# Patient Record
Sex: Female | Born: 1950 | Race: Black or African American | Hispanic: No | Smoking: Former smoker
Health system: Southern US, Community
[De-identification: ages and names within clinical notes are randomized; demographics above are authoritative.]

## PROBLEM LIST (undated history)

## (undated) DIAGNOSIS — D279 Benign neoplasm of unspecified ovary: Secondary | ICD-10-CM

## (undated) DIAGNOSIS — J45909 Unspecified asthma, uncomplicated: Secondary | ICD-10-CM

## (undated) HISTORY — PX: ABDOMINAL HYSTERECTOMY: SHX81

## (undated) HISTORY — DX: Benign neoplasm of unspecified ovary: D27.9

---

## 1997-10-03 HISTORY — PX: SHOULDER SURGERY: SHX246

## 1998-08-23 ENCOUNTER — Emergency Department (HOSPITAL_COMMUNITY): Admission: EM | Admit: 1998-08-23 | Discharge: 1998-08-23 | Payer: Self-pay | Admitting: Emergency Medicine

## 1998-12-22 ENCOUNTER — Ambulatory Visit (HOSPITAL_BASED_OUTPATIENT_CLINIC_OR_DEPARTMENT_OTHER): Admission: RE | Admit: 1998-12-22 | Discharge: 1998-12-22 | Payer: Self-pay | Admitting: Orthopaedic Surgery

## 1999-11-02 ENCOUNTER — Other Ambulatory Visit: Admission: RE | Admit: 1999-11-02 | Discharge: 1999-11-02 | Payer: Self-pay | Admitting: Internal Medicine

## 1999-12-01 ENCOUNTER — Encounter: Admission: RE | Admit: 1999-12-01 | Discharge: 1999-12-01 | Payer: Self-pay | Admitting: Internal Medicine

## 1999-12-01 ENCOUNTER — Encounter: Payer: Self-pay | Admitting: Internal Medicine

## 2000-11-15 ENCOUNTER — Other Ambulatory Visit: Admission: RE | Admit: 2000-11-15 | Discharge: 2000-11-15 | Payer: Self-pay | Admitting: Internal Medicine

## 2000-12-04 ENCOUNTER — Encounter: Payer: Self-pay | Admitting: Internal Medicine

## 2000-12-04 ENCOUNTER — Encounter: Admission: RE | Admit: 2000-12-04 | Discharge: 2000-12-04 | Payer: Self-pay | Admitting: Internal Medicine

## 2000-12-06 ENCOUNTER — Encounter: Admission: RE | Admit: 2000-12-06 | Discharge: 2000-12-06 | Payer: Self-pay | Admitting: Internal Medicine

## 2000-12-06 ENCOUNTER — Encounter: Payer: Self-pay | Admitting: Internal Medicine

## 2000-12-26 ENCOUNTER — Encounter (INDEPENDENT_AMBULATORY_CARE_PROVIDER_SITE_OTHER): Payer: Self-pay | Admitting: Specialist

## 2000-12-26 ENCOUNTER — Other Ambulatory Visit: Admission: RE | Admit: 2000-12-26 | Discharge: 2000-12-26 | Payer: Self-pay | Admitting: Gynecology

## 2001-06-08 ENCOUNTER — Encounter: Admission: RE | Admit: 2001-06-08 | Discharge: 2001-06-08 | Payer: Self-pay | Admitting: Internal Medicine

## 2001-06-08 ENCOUNTER — Encounter: Payer: Self-pay | Admitting: Internal Medicine

## 2001-06-22 ENCOUNTER — Other Ambulatory Visit: Admission: RE | Admit: 2001-06-22 | Discharge: 2001-06-22 | Payer: Self-pay | Admitting: Gynecology

## 2001-06-22 ENCOUNTER — Encounter (INDEPENDENT_AMBULATORY_CARE_PROVIDER_SITE_OTHER): Payer: Self-pay | Admitting: Specialist

## 2001-09-12 ENCOUNTER — Encounter (INDEPENDENT_AMBULATORY_CARE_PROVIDER_SITE_OTHER): Payer: Self-pay | Admitting: *Deleted

## 2001-09-12 ENCOUNTER — Inpatient Hospital Stay (HOSPITAL_COMMUNITY): Admission: RE | Admit: 2001-09-12 | Discharge: 2001-09-13 | Payer: Self-pay | Admitting: Gynecology

## 2001-12-14 ENCOUNTER — Encounter: Admission: RE | Admit: 2001-12-14 | Discharge: 2001-12-14 | Payer: Self-pay | Admitting: Internal Medicine

## 2001-12-14 ENCOUNTER — Encounter: Payer: Self-pay | Admitting: Internal Medicine

## 2002-12-17 ENCOUNTER — Other Ambulatory Visit: Admission: RE | Admit: 2002-12-17 | Discharge: 2002-12-17 | Payer: Self-pay | Admitting: Gynecology

## 2002-12-20 ENCOUNTER — Encounter: Payer: Self-pay | Admitting: Internal Medicine

## 2002-12-20 ENCOUNTER — Encounter: Admission: RE | Admit: 2002-12-20 | Discharge: 2002-12-20 | Payer: Self-pay | Admitting: Internal Medicine

## 2004-01-02 ENCOUNTER — Other Ambulatory Visit: Admission: RE | Admit: 2004-01-02 | Discharge: 2004-01-02 | Payer: Self-pay | Admitting: Gynecology

## 2004-01-09 ENCOUNTER — Encounter: Admission: RE | Admit: 2004-01-09 | Discharge: 2004-01-09 | Payer: Self-pay | Admitting: Internal Medicine

## 2005-01-05 ENCOUNTER — Other Ambulatory Visit: Admission: RE | Admit: 2005-01-05 | Discharge: 2005-01-05 | Payer: Self-pay | Admitting: Gynecology

## 2005-01-21 ENCOUNTER — Encounter: Admission: RE | Admit: 2005-01-21 | Discharge: 2005-01-21 | Payer: Self-pay | Admitting: Internal Medicine

## 2005-06-19 ENCOUNTER — Emergency Department (HOSPITAL_COMMUNITY): Admission: EM | Admit: 2005-06-19 | Discharge: 2005-06-19 | Payer: Self-pay | Admitting: Family Medicine

## 2005-07-07 ENCOUNTER — Encounter: Admission: RE | Admit: 2005-07-07 | Discharge: 2005-07-07 | Payer: Self-pay | Admitting: Internal Medicine

## 2006-03-09 ENCOUNTER — Other Ambulatory Visit: Admission: RE | Admit: 2006-03-09 | Discharge: 2006-03-09 | Payer: Self-pay | Admitting: Gynecology

## 2006-04-04 ENCOUNTER — Encounter: Admission: RE | Admit: 2006-04-04 | Discharge: 2006-04-04 | Payer: Self-pay | Admitting: Orthopaedic Surgery

## 2007-04-05 ENCOUNTER — Other Ambulatory Visit: Admission: RE | Admit: 2007-04-05 | Discharge: 2007-04-05 | Payer: Self-pay | Admitting: Gynecology

## 2007-05-08 ENCOUNTER — Encounter: Admission: RE | Admit: 2007-05-08 | Discharge: 2007-05-08 | Payer: Self-pay | Admitting: Internal Medicine

## 2008-12-02 ENCOUNTER — Encounter: Admission: RE | Admit: 2008-12-02 | Discharge: 2008-12-02 | Payer: Self-pay | Admitting: Internal Medicine

## 2010-01-06 ENCOUNTER — Encounter: Admission: RE | Admit: 2010-01-06 | Discharge: 2010-01-06 | Payer: Self-pay | Admitting: Internal Medicine

## 2011-01-02 ENCOUNTER — Emergency Department (HOSPITAL_COMMUNITY)
Admission: EM | Admit: 2011-01-02 | Discharge: 2011-01-02 | Disposition: A | Discharge status: Home / Self Care | Payer: BC Managed Care – PPO | Attending: Emergency Medicine | Admitting: Emergency Medicine

## 2011-01-02 ENCOUNTER — Emergency Department (HOSPITAL_COMMUNITY): Payer: BC Managed Care – PPO

## 2011-01-02 ENCOUNTER — Inpatient Hospital Stay (INDEPENDENT_AMBULATORY_CARE_PROVIDER_SITE_OTHER)
Admission: RE | Admit: 2011-01-02 | Discharge: 2011-01-02 | Disposition: A | Discharge status: Home / Self Care | Payer: BC Managed Care – PPO | Source: Ambulatory Visit | Attending: Family Medicine | Admitting: Family Medicine

## 2011-01-02 DIAGNOSIS — R079 Chest pain, unspecified: Secondary | ICD-10-CM

## 2011-01-02 DIAGNOSIS — M1990 Unspecified osteoarthritis, unspecified site: Secondary | ICD-10-CM | POA: Insufficient documentation

## 2011-01-02 DIAGNOSIS — R112 Nausea with vomiting, unspecified: Secondary | ICD-10-CM | POA: Insufficient documentation

## 2011-01-02 DIAGNOSIS — R61 Generalized hyperhidrosis: Secondary | ICD-10-CM | POA: Insufficient documentation

## 2011-01-02 LAB — CBC
HCT: 41 % (ref 36.0–46.0)
Hemoglobin: 14.3 g/dL (ref 12.0–15.0)
MCH: 29.7 pg (ref 26.0–34.0)
MCHC: 34.9 g/dL (ref 30.0–36.0)
MCV: 85.1 fL (ref 78.0–100.0)
RDW: 12.8 % (ref 11.5–15.5)

## 2011-01-02 LAB — COMPREHENSIVE METABOLIC PANEL
ALT: 18 U/L (ref 0–35)
BUN: 12 mg/dL (ref 6–23)
CO2: 22 mEq/L (ref 19–32)
Calcium: 8.7 mg/dL (ref 8.4–10.5)
Creatinine, Ser: 0.65 mg/dL (ref 0.4–1.2)
GFR calc non Af Amer: >60 mL/min (ref >60)
Glucose, Bld: 97 mg/dL (ref 70–99)
Sodium: 139 mEq/L (ref 135–145)

## 2011-01-02 LAB — DIFFERENTIAL
Eosinophils Relative: 6 % — ABNORMAL HIGH (ref 0–5)
Lymphocytes Relative: 43 % (ref 12–46)
Lymphs Abs: 1.9 10*3/uL (ref 0.7–4.0)
Monocytes Absolute: 0.6 10*3/uL (ref 0.1–1.0)
Monocytes Relative: 13 % — ABNORMAL HIGH (ref 3–12)
Neutro Abs: 1.7 10*3/uL (ref 1.7–7.7)

## 2011-01-02 LAB — POCT CARDIAC MARKERS: CKMB, poc: 1.2 ng/mL (ref 1.0–8.0)

## 2011-01-02 LAB — LIPASE, BLOOD: Lipase: 37 U/L (ref 11–59)

## 2011-02-18 NOTE — Op Note (Signed)
[facility]  Patient:    [name], [name] Visit Number: [id number] MRN: [id number]          Service Type: GYN Location: 4W [id number] 01 Attending Physician:  Rolinda Roan Dictated by:   Leatha Gilding. Mezer, M.D. Proc. Date: 09/12/01 Admit Date:  09/12/2001   CC:         Harl Bowie, M.D.  Modesta Messing, M.D.   Operative Report  PREOPERATIVE DIAGNOSIS:  Endometrial hyperplasia.  POSTOPERATIVE DIAGNOSIS:  Endometrial hyperplasia, pending pathology.  OPERATION PERFORMED:  Total abdominal hysterectomy.  SURGEON:  Leatha Gilding. Mezer, M.D.  ASSISTANT:  Harl Bowie, M.D.  ANESTHESIA:  General endotracheal.  PREPARATION:  Betadine.  INDICATIONS FOR PROCEDURE:  The patient is a 60 year old female, who was referred for evaluation of endometrial cells on Pap smear.  An endometrial biopsy revealed endometrial hyperplasia.  Further biopsy after Provera revealed complex endometrial hyperplasia and despite further Provera, endometrial hyperplasia was still present, and the patient has decided to proceed with hysterectomy.  FINDINGS:  The uterus was top-normal in size.  The ovaries were grossly normal bilaterally, and there were adhesions of bowel to the left pelvic sidewall that did not interfere with the surgery.  DESCRIPTION OF PROCEDURE:  With the patient in the supine position, she was prepped and draped in the routine fashion.  A Pfannenstiel incision was made through the skin and subcutaneous tissue.  The fascia and peritoneum were opened without difficulty.  A brief exploration of her upper abdomen was benign.  Exploration of the pelvis revealed the uterus to be top-normal in size and, as stated above, the ovaries were grossly normal.  The pediatric Balfour retractor was inserted, taking great care to be certain that the bowel was out of harms way.  The round ligaments were suture ligated with #1 chromic and divide.  The  anterior leaf of the broad ligaments was then opened bilaterally and the bladder easily taken down sharply and bluntly.  The uteroovarian ligaments were clamped, cut, and free tied with #1 chromic and then suture ligated with #1 chromic.  The uterine arteries were then clamped, cut, and suture ligated with #1 chromic.  The cardinal ligaments taken in separate bites, clamped, cut, and suture ligated with #1 chromic.  The uterosacral ligaments were taken separately clamped, cut, and suture ligated with #1 chromic.  The vagina was entered laterally on the left side and the specimen excised with circumferential dissection.  As the patient has previously had a cone biopsy of the cervix, care was taken to completely excise the cervix.  The angles of the vaginal cuff were then closed with Telinde-type angle sutures of #1 chromic, and the cuff was then whipped anteriorly and posteriorly with running lock #1 chromic suture.  Two anterior-posterior stitches were placed to close the opening of the vagina. The pelvis was irrigated, and careful attention was paid to hemostasis.  With the ureters re-identified and found to be out of harms way and hemostasis intact, the bladder flap was then placed over the vaginal cuff with a running 3-0 Vicryl suture.  At the completion of the procedure, an effort was made to place the large bowel in the cul-de-sac.  The ovaries were elevated.  The omentum was brought down.  The abdomen was closed in layers using a running 2-0 Vicryl on the peritoneum, running 0 Vicryl at the midline bilaterally on the fascia.  Hemostasis was assured in the subcutaneous tissue, and the skin was closed with  staples.  The estimated blood loss was approximately 250 cc. The sponge, needle, and instrument counts were correct x 2.  The patient tolerated the procedure well and was taken to the recovery room in satisfactory condition. Dictated by:   Leatha Gilding. Mezer, M.D. Attending Physician:   Rolinda Roan DD:  09/12/01 TD:  09/12/01 Job: [id number]

## 2011-02-18 NOTE — H&P (Signed)
[facility]  Patient:    [name], [name]. Visit Number: [id number] MRN: [id number]          Service Type: Attending:  Leatha Gilding. Mezer, M.D. Dictated by:   Leatha Gilding. Mezer, M.D. Adm. Date:  09/12/01   CC:         Modesta Messing, M.D.   History and Physical  ADMITTING DIAGNOSIS:  Endometrial hyperplasia.  HISTORY OF PRESENT ILLNESS:  The patient is a 60 year old gravida 3, para 1, AB 2 female, admitted with endometrial hyperplasia for a total abdominal hysterectomy, question bilateral salpingo-oophorectomy.  The patient presented in March 2002, kindly referred by Modesta Messing, M.D., for evaluation of endometrial cells on Pap smear.  Endometrial biopsy at that time revealed a focal simple hyperplasia.  The patient was treated with Provera, and repeat endometrial biopsy in September 2002 revealed a complex atypical hyperplasia. A hysteroscopy/D&C was performed at that time, which revealed less severe hyperplasia with the pathology report being returned as a disordered proliferative endometrium with focal hyperplasia.  There was focal complex hyperplasia, but no atypia was identified.  Options were discussed with the patient at that time, which included more intensive progestin therapy with repeat biopsy versus total abdominal hysterectomy.  The patient strongly wished to proceed with hysterectomy.  A total abdominal hysterectomy, question bilateral salpingo-oophorectomy had been discussed with the patient in detail and potential complications including but not limited to anesthesia, injury to the bowel, bladder, ureters, possible fistula formation, possible blood loss with transfusion and sequelae, and infection have been discussed. The pros and cons of removing versus saving the ovaries have been discussed, and the patient wishes to leave her ovaries unless there is surgical indication to remove them.  Postoperative expectations and  restrictions have been reviewed in detail.  PAST MEDICAL HISTORY:  Medical noncontributory.  PAST SURGICAL HISTORY:  Shoulder, a cone biopsy, hysteroscopy/D&C.  MEDICATIONS:  Vitamins.  ALLERGIES:  AMPICILLIN (a rash, but has taken penicillin products since then without difficulty).  SOCIAL HISTORY:  The patient is retired from [employer] and does not smoke. ETOH social.  FAMILY HISTORY:  Positive for carcinoma of the bladder and pancreas.  PHYSICAL EXAMINATION:  HEENT:  Negative.  CHEST:  The lungs are clear.  CARDIAC:  The heart is without murmurs.  BREASTS:  Without masses or discharge.  ABDOMEN:  Soft and nontender.  PELVIC:  BUS, vagina, and cervix normal.  The uterus is anterior, top normal in size.   The adnexa without palpable masses.  EXTREMITIES:  Negative.  IMPRESSION:  Endometrial hyperplasia.  PLAN:  Total abdominal hysterectomy, question bilateral salpingo-oophorectomy. Dictated by:   Leatha Gilding. Mezer, M.D. Attending:  Leatha Gilding. Mezer, M.D. DD:  09/12/01 TD:  09/12/01 Job: [id number]

## 2011-06-16 ENCOUNTER — Other Ambulatory Visit: Payer: Self-pay | Admitting: Gynecologic Oncology

## 2011-06-16 ENCOUNTER — Ambulatory Visit (HOSPITAL_COMMUNITY)
Admission: RE | Admit: 2011-06-16 | Discharge: 2011-06-16 | Disposition: A | Discharge status: Home / Self Care | Payer: BC Managed Care – PPO | Source: Ambulatory Visit | Attending: Gynecologic Oncology | Admitting: Gynecologic Oncology

## 2011-06-16 ENCOUNTER — Ambulatory Visit: Payer: BC Managed Care – PPO | Attending: Gynecologic Oncology | Admitting: Gynecologic Oncology

## 2011-06-16 DIAGNOSIS — R1900 Intra-abdominal and pelvic swelling, mass and lump, unspecified site: Secondary | ICD-10-CM

## 2011-06-16 DIAGNOSIS — N879 Dysplasia of cervix uteri, unspecified: Secondary | ICD-10-CM | POA: Insufficient documentation

## 2011-06-16 DIAGNOSIS — N839 Noninflammatory disorder of ovary, fallopian tube and broad ligament, unspecified: Secondary | ICD-10-CM | POA: Insufficient documentation

## 2011-06-16 DIAGNOSIS — N9489 Other specified conditions associated with female genital organs and menstrual cycle: Secondary | ICD-10-CM | POA: Insufficient documentation

## 2011-06-16 DIAGNOSIS — Z90710 Acquired absence of both cervix and uterus: Secondary | ICD-10-CM | POA: Insufficient documentation

## 2011-06-17 NOTE — Consult Note (Signed)
[name], [name] NO.:  [id number]  MEDICAL RECORD NO.:  [id number]  LOCATION:  U                            FACILITY:  [facility]  PHYSICIAN:  Laurette Schimke, MD     DATE OF BIRTH:  1950/12/06  DATE OF CONSULTATION:  06/16/2011 DATE OF DISCHARGE:                                CONSULTATION   REASON FOR VISIT:  Consideration was requested by Dr. Chevis Pretty for evaluation of a pelvic mass.  HISTORY OF PRESENT ILLNESS:  This is a 60 year old gravida 2, para 1, last normal menstrual period in 2002.  Ms. [name] reports that she underwent total abdominal hysterectomy for leiomyoma and an abnormal Pap.  Final pathology was notable for complex atypical hyperplasia with atypia.  There was no evidence of carcinoma.  Ms. [name] was in her usual state of health until she had some physical exertion and she felt that she pulled her muscle.  She underwent chiropractor evaluation, and at that time, imaging was notable for suspicion of a pelvic mass.  Dr. Chevis Pretty subsequently obtained a pelvic ultrasound, which suggested the presence of a 6 cm mass measuring 6.8 cm that was complex in nature without any evidence of ascites.  PAST MEDICAL HISTORY:  Right carpal tunnel syndrome.  PAST SURGICAL HISTORY:  Right arthroscopic shoulder surgery in 1999, total abdominal hysterectomy in December 2002, cervical cone greater than 25 years ago, and D and C in 1972.  SCREENING HISTORY:  Mammogram in 2012 within normal limits.  Colonoscopy several years ago.  PAST GYNECOLOGIC HISTORY:  Gravida 2, para 1, one vaginal delivery.  She reports likely a cornual ectopic managed with a D and C and a history of a cervical cone for abnormal Pap 25 years ago, with no recurrence of abnormal Pap until prior to her hysterectomy.  SOCIAL HISTORY:  She reports tobacco use 1 pack per week for over 10 years.  She stopped smoking approximately 2 weeks ago.  Occasional alcohol use.  She is employed as a  Medical sales representative as Event organiser, daughter temporarily resides with her.  FAMILY HISTORY:  Reports her mother with uterine cancer in her 66s, who subsequently died of an MI.  Father with bladder cancer diagnosed in 45s.  Brother with pancreatic cancer diagnosed at age of 84 and recent death.  Maternal cousin with lymphoma diagnosed at the age of 28.  MEDICATIONS: 1. Vicodin. 2. Silica powder. 3. Multivitamins. 4. Vitamin D.  ALLERGIES:  No known drug allergies.  PHYSICAL EXAMINATION:  GENERAL:  Well-developed female, in no acute distress.  Weight 184 pounds, height 5 feet 3 inches. VITAL SIGNS:  Blood pressure 126/80, pulse 64, temperature 98.2, and respiratory rate 16. LYMPH NODES:  Reveal no cervical, supraclavicular, or inguinal adenopathy. CHEST:  Clear to auscultation. HEART:  Regular rate and rhythm. BACK:  No CVA tenderness. ABDOMEN:  Soft, obese.  Pfannenstiel incision is evident without any evidence of a hernia.  No palpable fluid waves or omental cake. PELVIC:  Normal external genitalia, Bartholin's, urethral, and Skene's. Smooth, but fixed right lower quadrant mass is appreciated.  Mass is soft.  No nodules are appreciated. RECTAL:  Good anal sphincter tone without any masses. EXTREMITIES:  1 to 2+ edema of lower extremities.  OTHER IMAGING:  Pelvic ultrasound confirmed the presence of a complex right adnexal mass 6 x 6 cm with solid components.  The mass appeared exophytic.  IMPRESSION:  Ms. [name] is a 60 year old with a solid complex adnexal mass of the right adnexa, history of prior hysterectomy.  CA-125 is not available.  RECOMMENDATION:  For bilateral salpingo-oophorectomy and staging as indicated.  A CA-125 will be requested.  If the CA-125 is within normal limits, based on examination and anesthesia, consideration will be placed for a transverse incision.  Otherwise, the patient is aware that more likely it will be a vertical incision.  If malignancy is  encountered other indicated procedures may be inclusive of lymph node dissection, omentectomy, biopsies, and debulking.  Risks and benefits of surgery were discussed with the patient and her daughter and all their questions were answered.  Surgery scheduled with me on Tuesday July 05, 2011.     Laurette Schimke, MD     WB/MEDQ  D:  06/16/2011  T:  06/16/2011  Job:  [id number]  cc:   Leatha Gilding. Mezer, M.D. Fax: [phone]  Telford Nab, R.N. [address] [place], [place] [address]  Electronically Signed by Laurette Schimke MD on 06/17/2011 10:43:48 AM

## 2011-07-01 ENCOUNTER — Other Ambulatory Visit: Payer: Self-pay | Admitting: Gynecologic Oncology

## 2011-07-01 ENCOUNTER — Encounter (HOSPITAL_COMMUNITY): Payer: BC Managed Care – PPO

## 2011-07-01 LAB — DIFFERENTIAL
Basophils Relative: 1 % (ref 0–1)
Eosinophils Relative: 5 % (ref 0–5)
Lymphocytes Relative: 52 % — ABNORMAL HIGH (ref 12–46)
Monocytes Absolute: 0.3 10*3/uL (ref 0.1–1.0)
Monocytes Relative: 8 % (ref 3–12)
Neutro Abs: 1.3 10*3/uL — ABNORMAL LOW (ref 1.7–7.7)

## 2011-07-01 LAB — CBC
HCT: 39.2 % (ref 36.0–46.0)
Hemoglobin: 13.5 g/dL (ref 12.0–15.0)
MCH: 29 pg (ref 26.0–34.0)
MCHC: 34.4 g/dL (ref 30.0–36.0)

## 2011-07-01 LAB — COMPREHENSIVE METABOLIC PANEL
BUN: 15 mg/dL (ref 6–23)
Calcium: 9.2 mg/dL (ref 8.4–10.5)
GFR calc Af Amer: >60 mL/min (ref >60)
Glucose, Bld: 104 mg/dL — ABNORMAL HIGH (ref 70–99)
Total Protein: 7.2 g/dL (ref 6.0–8.3)

## 2011-07-01 LAB — SURGICAL PCR SCREEN
MRSA, PCR: NEGATIVE
Staphylococcus aureus: NEGATIVE

## 2011-07-04 HISTORY — PX: BILATERAL SALPINGOOPHORECTOMY: SHX1223

## 2011-07-05 ENCOUNTER — Inpatient Hospital Stay (HOSPITAL_COMMUNITY)
Admission: RE | Admit: 2011-07-05 | Discharge: 2011-07-07 | DRG: 359 | Disposition: A | Discharge status: Home / Self Care | Payer: BC Managed Care – PPO | Source: Ambulatory Visit | Attending: Obstetrics & Gynecology | Admitting: Obstetrics & Gynecology

## 2011-07-05 ENCOUNTER — Other Ambulatory Visit: Payer: Self-pay | Admitting: Gynecologic Oncology

## 2011-07-05 DIAGNOSIS — D279 Benign neoplasm of unspecified ovary: Principal | ICD-10-CM | POA: Diagnosis present

## 2011-07-05 LAB — SAMPLE TO BLOOD BANK

## 2011-07-06 LAB — CBC
MCHC: 33.4 g/dL (ref 30.0–36.0)
RDW: 14 % (ref 11.5–15.5)

## 2011-07-11 NOTE — Op Note (Signed)
[name], [name] NO.:  [id number]  MEDICAL RECORD NO.:  [id number]  LOCATION:  1536                         FACILITY:  [facility]  PHYSICIAN:  Laurette Schimke, MD     DATE OF BIRTH:  [birth year]  DATE OF PROCEDURE:  07/05/2011 DATE OF DISCHARGE:                              OPERATIVE REPORT   PREOPERATIVE DIAGNOSIS:  Pelvic mass.  POSTOPERATIVE DIAGNOSIS:  Bilateral benign pelvic masses, ICD-9 220.0.  PROCEDURE:  Exploratory laparotomy, bilateral salpingo-oophorectomy.  SURGEON:  Laurette Schimke, MD  ASSISTANT:  Roseanna Rainbow, M.D. and Telford Nab, R.N.  ANESTHESIA:  General endotracheal.  FINDINGS:  A 10 cm solid right pelvic mass, 5 cm solid left pelvic mass adherent to the cecum.  SPECIMENS:  Bilateral ovaries and tubes.  DESCRIPTION OF PROCEDURE:  Patient was taken to operating room, placed on general endotracheal anesthesia, was placed in dorsal lithotomy position and prepped and draped in usual sterile fashion.  The midline incision was made just superior to the reflection of her pannus approximately 10 cm, the abdomen was entered.  The pelvic washings were obtained.  The abdomen and pelvis were explored were notable for a 10-cm right-sided pelvic mass and a left-sided solid left adnexal mass adherent to the cecum.  The adhesions surrounding the right pelvic mass were reduced.  The laps were placed in the gutters and the small bowel packed into the upper abdomen.  The retroperitoneal space was entered. The ureter was identified.  The infundibulopelvic ligament was identified, secured, transected, and ligated.  The mass, the broad ligament in its attachments to the mass were dissected free.  The mass was sent for frozen section.  On the contralateral side, the sigmoid was noted to be adherent and superior to the mass.  These dissections were transected.  There was some deserosalization of the bowel.  The mucosa, however, was intact.   Muscularis was intact with 2 interrupted Vicryl sutures were placed to oversew the area of bruising.  The retroperitoneal space was entered, and the mass was dissected from its broad ligament attachments.  The infundibulopelvic ligament was identified, skeletonized, clamped, transected, ligated and sent for frozen section.  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  The abdomen was copiously irrigated and drained with water.  Hemostasis was assured.  Abdominal packs were removed.  The fascia was closed with 0 loop PDS sutures, overlapping in the midline. Subcutaneous tissues were copiously irrigated and drained and Marcaine injected into the incision.  The subcutaneous tissue approximately 7 mm from the skin was approximated with a running suture.  The skin was closed with a running subcuticular 4-0 Vicryl.  Sponges and needle count correct x3.  COMPLICATIONS:  None.  DRAINS:  Foley draining clear urine.  DISPOSITION:  The patient was extubated and taken to the recovery room in stable condition.     Laurette Schimke, MD     WB/MEDQ  D:  07/05/2011  T:  07/06/2011  Job:  [id number]  cc:   Telford Nab, R.N. [address] [place], [place] [address]  Roseanna Rainbow, M.D. Fax: [phone]  Leatha Gilding. Mezer, M.D. Fax: [phone]  Electronically Signed  by Laurette Schimke MD on 07/11/2011 03:14:30 PM

## 2011-09-01 ENCOUNTER — Encounter: Payer: Self-pay | Admitting: Gynecologic Oncology

## 2011-09-01 ENCOUNTER — Ambulatory Visit: Payer: BC Managed Care – PPO | Attending: Gynecologic Oncology | Admitting: Gynecologic Oncology

## 2011-09-01 VITALS — BP 120/70 | HR 78 | Temp 98.6°F | Resp 18 | Ht 63.0 in | Wt 187.9 lb

## 2011-09-01 DIAGNOSIS — Z90710 Acquired absence of both cervix and uterus: Secondary | ICD-10-CM | POA: Insufficient documentation

## 2011-09-01 DIAGNOSIS — N839 Noninflammatory disorder of ovary, fallopian tube and broad ligament, unspecified: Secondary | ICD-10-CM | POA: Insufficient documentation

## 2011-09-01 DIAGNOSIS — Z9079 Acquired absence of other genital organ(s): Secondary | ICD-10-CM | POA: Insufficient documentation

## 2011-09-01 DIAGNOSIS — D279 Benign neoplasm of unspecified ovary: Secondary | ICD-10-CM

## 2011-09-01 HISTORY — DX: Benign neoplasm of unspecified ovary: D27.9
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
## 2011-09-01 NOTE — Patient Instructions (Signed)
Followup with Dr. Chevis Pretty

## 2011-11-17 ENCOUNTER — Ambulatory Visit (INDEPENDENT_AMBULATORY_CARE_PROVIDER_SITE_OTHER): Payer: BC Managed Care – PPO | Admitting: Emergency Medicine

## 2011-11-17 VITALS — BP 156/90 | HR 92 | Temp 98.5°F | Resp 18 | Ht 64.5 in | Wt 191.6 lb

## 2011-11-17 DIAGNOSIS — J9801 Acute bronchospasm: Secondary | ICD-10-CM

## 2011-11-17 DIAGNOSIS — J45909 Unspecified asthma, uncomplicated: Secondary | ICD-10-CM

## 2011-11-17 DIAGNOSIS — J209 Acute bronchitis, unspecified: Secondary | ICD-10-CM

## 2011-11-17 DIAGNOSIS — J40 Bronchitis, not specified as acute or chronic: Secondary | ICD-10-CM

## 2011-11-17 DIAGNOSIS — R05 Cough: Secondary | ICD-10-CM

## 2011-11-17 MED ORDER — ALBUTEROL SULFATE (2.5 MG/3ML) 0.083% IN NEBU
2.5000 mg | INHALATION_SOLUTION | Freq: Once | RESPIRATORY_TRACT | Status: AC
  Administered 2011-11-17: 2.5 mg via RESPIRATORY_TRACT

## 2011-11-17 MED ORDER — ALBUTEROL SULFATE HFA 108 (90 BASE) MCG/ACT IN AERS: 2.0000 | INHALATION_SPRAY | Freq: Four times a day (QID) | RESPIRATORY_TRACT | Status: DC | PRN

## 2011-11-17 MED ORDER — HYDROCODONE-HOMATROPINE 5-1.5 MG/5ML PO SYRP: 5.0000 mL | ORAL_SOLUTION | Freq: Three times a day (TID) | ORAL | Status: AC | PRN

## 2011-11-17 MED ORDER — AZITHROMYCIN 250 MG PO TABS: ORAL_TABLET | ORAL | Status: AC

## 2011-11-17 NOTE — Progress Notes (Signed)
  Subjective:    Patient ID: [name], female    DOB: [birth year], 61 y.o.   MRN: [id number]  HPI patient enters with head congestion runny nose. She also has a cough with some wheezing. She rarely smokes. She's had a minimal productive cough but has had some yellowish sputum.    Review of Systems is noncontributory except as relates to the present illness.     Objective:   Physical Exam HEENT exam reveals reddish discoloration of the turbinates. TMs are clear throat normal neck supple without adenopathy chest exam reveals bilateral expiratory wheezes        Assessment & Plan:  Assessment is acute bronchitis sinusitis with reactive airways disease. Patient did improve after nebulizer treatment in the office.

## 2011-11-17 NOTE — Patient Instructions (Signed)

## 2014-06-04 ENCOUNTER — Other Ambulatory Visit: Payer: Self-pay | Admitting: Gynecology

## 2014-06-05 LAB — CYTOLOGY - PAP

## 2015-05-07 DIAGNOSIS — I10 Essential (primary) hypertension: Secondary | ICD-10-CM | POA: Diagnosis not present

## 2015-05-07 DIAGNOSIS — E780 Pure hypercholesterolemia: Secondary | ICD-10-CM | POA: Diagnosis not present

## 2015-05-07 DIAGNOSIS — G8929 Other chronic pain: Secondary | ICD-10-CM | POA: Diagnosis not present

## 2015-05-07 DIAGNOSIS — E663 Overweight: Secondary | ICD-10-CM | POA: Diagnosis not present

## 2015-05-07 DIAGNOSIS — Z6835 Body mass index (BMI) 35.0-35.9, adult: Secondary | ICD-10-CM | POA: Diagnosis not present

## 2015-05-07 DIAGNOSIS — M810 Age-related osteoporosis without current pathological fracture: Secondary | ICD-10-CM | POA: Diagnosis not present

## 2015-05-07 DIAGNOSIS — Z0000 Encounter for general adult medical examination without abnormal findings: Secondary | ICD-10-CM | POA: Diagnosis not present

## 2015-06-18 DIAGNOSIS — Z1231 Encounter for screening mammogram for malignant neoplasm of breast: Secondary | ICD-10-CM | POA: Diagnosis not present

## 2015-06-18 DIAGNOSIS — Z6834 Body mass index (BMI) 34.0-34.9, adult: Secondary | ICD-10-CM | POA: Diagnosis not present

## 2015-06-18 DIAGNOSIS — Z01419 Encounter for gynecological examination (general) (routine) without abnormal findings: Secondary | ICD-10-CM | POA: Diagnosis not present

## 2015-07-22 ENCOUNTER — Other Ambulatory Visit: Payer: Self-pay | Admitting: Gastroenterology

## 2015-08-13 ENCOUNTER — Encounter (HOSPITAL_COMMUNITY): Payer: Self-pay | Admitting: *Deleted

## 2015-08-20 ENCOUNTER — Ambulatory Visit (HOSPITAL_COMMUNITY): Payer: Medicare Other | Admitting: Anesthesiology

## 2015-08-20 ENCOUNTER — Encounter (HOSPITAL_COMMUNITY): Payer: Self-pay

## 2015-08-20 ENCOUNTER — Ambulatory Visit (HOSPITAL_COMMUNITY)
Admission: RE | Admit: 2015-08-20 | Discharge: 2015-08-20 | Disposition: A | Discharge status: Home / Self Care | Payer: Medicare Other | Source: Ambulatory Visit | Attending: Gastroenterology | Admitting: Gastroenterology

## 2015-08-20 ENCOUNTER — Encounter (HOSPITAL_COMMUNITY)
Admission: RE | Disposition: A | Discharge status: Home / Self Care | Payer: Self-pay | Source: Ambulatory Visit | Attending: Gastroenterology

## 2015-08-20 DIAGNOSIS — J45909 Unspecified asthma, uncomplicated: Secondary | ICD-10-CM | POA: Diagnosis not present

## 2015-08-20 DIAGNOSIS — Z87891 Personal history of nicotine dependence: Secondary | ICD-10-CM | POA: Insufficient documentation

## 2015-08-20 DIAGNOSIS — E7800 Pure hypercholesterolemia, unspecified: Secondary | ICD-10-CM | POA: Diagnosis not present

## 2015-08-20 DIAGNOSIS — Z1211 Encounter for screening for malignant neoplasm of colon: Secondary | ICD-10-CM | POA: Insufficient documentation

## 2015-08-20 HISTORY — DX: Unspecified asthma, uncomplicated: J45.909

## 2015-08-20 HISTORY — PX: COLONOSCOPY WITH PROPOFOL: SHX5780

## 2015-08-20 SURGERY — COLONOSCOPY WITH PROPOFOL
Anesthesia: Monitor Anesthesia Care

## 2015-08-20 MED ORDER — LACTATED RINGERS IV SOLN
INTRAVENOUS | Status: DC | PRN
  Administered 2015-08-20: 08:00:00 via INTRAVENOUS

## 2015-08-20 MED ORDER — PROPOFOL 10 MG/ML IV BOLUS
INTRAVENOUS | Status: AC
Start: 2015-08-20 — End: 2015-08-20
  Filled 2015-08-20: qty 40

## 2015-08-20 MED ORDER — PROPOFOL 10 MG/ML IV BOLUS
INTRAVENOUS | Status: DC | PRN
  Administered 2015-08-20: 40 mg via INTRAVENOUS
  Administered 2015-08-20 (×5): 20 mg via INTRAVENOUS
  Administered 2015-08-20: 40 mg via INTRAVENOUS
  Administered 2015-08-20 (×6): 20 mg via INTRAVENOUS

## 2015-08-20 SURGICAL SUPPLY — 22 items

## 2015-08-20 NOTE — Transfer of Care (Signed)
Immediate Anesthesia Transfer of Care Note  Patient: [name]  Procedure(s) Performed: Procedure(s): COLONOSCOPY WITH PROPOFOL (N/A)  Patient Location: PACU  Anesthesia Type:MAC  Level of Consciousness: sedated  Airway & Oxygen Therapy: Patient Spontanous Breathing and Patient connected to nasal cannula oxygen  Post-op Assessment: Report given to RN and Post -op Vital signs reviewed and stable  Post vital signs: Reviewed and stable  Last Vitals:  Filed Vitals:   08/20/15 0814  BP: 206/109  Pulse: 98  Temp: 37.1 C  Resp: 20    Complications: No apparent anesthesia complications

## 2015-08-20 NOTE — Anesthesia Preprocedure Evaluation (Signed)
Anesthesia Evaluation  Patient identified by MRN, date of birth, ID band Patient awake    Reviewed: Allergy & Precautions, NPO status , Patient's Chart, lab work & pertinent test results  Airway Mallampati: II  TM Distance: >3 FB Neck ROM: Full    Dental no notable dental hx.    Pulmonary asthma , former smoker,    Pulmonary exam normal breath sounds clear to auscultation       Cardiovascular negative cardio ROS Normal cardiovascular exam Rhythm:Regular Rate:Normal     Neuro/Psych negative neurological ROS  negative psych ROS   GI/Hepatic negative GI ROS, Neg liver ROS,   Endo/Other  negative endocrine ROS  Renal/GU negative Renal ROS  negative genitourinary   Musculoskeletal negative musculoskeletal ROS (+)   Abdominal   Peds negative pediatric ROS (+)  Hematology negative hematology ROS (+)   Anesthesia Other Findings   Reproductive/Obstetrics negative OB ROS                             Anesthesia Physical Anesthesia Plan  ASA: II  Anesthesia Plan: MAC   Post-op Pain Management:    Induction:   Airway Management Planned: Simple Face Mask  Additional Equipment:   Intra-op Plan:   Post-operative Plan:   Informed Consent: I have reviewed the patients History and Physical, chart, labs and discussed the procedure including the risks, benefits and alternatives for the proposed anesthesia with the patient or authorized representative who has indicated his/her understanding and acceptance.   Dental advisory given  Plan Discussed with: CRNA  Anesthesia Plan Comments:         Anesthesia Quick Evaluation  

## 2015-08-20 NOTE — H&P (Signed)
  Procedure: Screening colonoscopy. Normal screening colonoscopy performed on 05/31/2005  History: The patient is a 64 year old female born 1950-10-23. She is scheduled to undergo a repeat screening colonoscopy today.  Past medical history: Hysterectomy to treat fibroid tumors in 2002. Right shoulder arthroscopic procedure performed in 1999. Carpal tunnel syndrome. Hypercholesterolemia. Alopecia. Osteoporosis. Benign ovarian tumor removed in October 2012. Cataracts.  Medication allergies: Ampicillin caused rash. Simvastatin caused joint aches.  Exam: The patient is alert and lying comfortably on the endoscopy stretcher. Abdomen is soft and nontender to palpation. Cardiac exam reveals a regular rhythm. Lungs are clear to auscultation.  Plan: Proceed with repeat screening colonoscopy

## 2015-08-20 NOTE — Discharge Instructions (Signed)

## 2015-08-20 NOTE — Anesthesia Postprocedure Evaluation (Signed)
  Anesthesia Post-op Note  Patient: [name]  Procedure(s) Performed: Procedure(s) (LRB): COLONOSCOPY WITH PROPOFOL (N/A)  Patient Location: PACU  Anesthesia Type: MAC  Level of Consciousness: awake and alert   Airway and Oxygen Therapy: Patient Spontanous Breathing  Post-op Pain: mild  Post-op Assessment: Post-op Vital signs reviewed, Patient's Cardiovascular Status Stable, Respiratory Function Stable, Patent Airway and No signs of Nausea or vomiting  Last Vitals:  Filed Vitals:   08/20/15 0850  BP: 132/63  Pulse: 82  Temp:   Resp: 15    Post-op Vital Signs: stable   Complications: No apparent anesthesia complications

## 2015-08-20 NOTE — Op Note (Signed)
Procedure: Screening colonoscopy. Normal screening colonoscopy performed on 05/31/2005  Endoscopist: Earle Gell  Premedication: Propofol administered by anesthesia  Procedure: The patient was placed in the left lateral decubitus position. Anal inspection and digital rectal exam were normal. The Pentax pediatric colonoscope was introduced into the rectum and advanced to the cecum. A normal-appearing appendiceal orifice was identified. A normal-appearing ileocecal valve was intubated and the terminal ileum inspected. Colonic preparation for the exam today was good. Withdrawal time was 7 minutes  Rectum. Normal. Retroflexed view of the distal rectum was normal  Sigmoid colon and descending colon. Normal  Splenic flexure. Normal  Transverse colon. Normal  Hepatic flexure. Normal  Ascending colon. Normal  Cecum and ileocecal valve. Normal  Terminal ileum. Normal  Assessment: Normal screening colonoscopy  Recommendation: Schedule repeat screening colonoscopy in 10 years

## 2015-08-24 ENCOUNTER — Encounter (HOSPITAL_COMMUNITY): Payer: Self-pay | Admitting: Gastroenterology

## 2016-05-13 DIAGNOSIS — G8929 Other chronic pain: Secondary | ICD-10-CM | POA: Diagnosis not present

## 2016-05-13 DIAGNOSIS — M810 Age-related osteoporosis without current pathological fracture: Secondary | ICD-10-CM | POA: Diagnosis not present

## 2016-05-13 DIAGNOSIS — Z0000 Encounter for general adult medical examination without abnormal findings: Secondary | ICD-10-CM | POA: Diagnosis not present

## 2016-05-13 DIAGNOSIS — I10 Essential (primary) hypertension: Secondary | ICD-10-CM | POA: Diagnosis not present

## 2016-05-13 DIAGNOSIS — Z1389 Encounter for screening for other disorder: Secondary | ICD-10-CM | POA: Diagnosis not present

## 2016-05-13 DIAGNOSIS — E7800 Pure hypercholesterolemia, unspecified: Secondary | ICD-10-CM | POA: Diagnosis not present

## 2016-07-07 DIAGNOSIS — M816 Localized osteoporosis [Lequesne]: Secondary | ICD-10-CM | POA: Diagnosis not present

## 2016-07-07 DIAGNOSIS — Z6834 Body mass index (BMI) 34.0-34.9, adult: Secondary | ICD-10-CM | POA: Diagnosis not present

## 2016-07-07 DIAGNOSIS — Z124 Encounter for screening for malignant neoplasm of cervix: Secondary | ICD-10-CM | POA: Diagnosis not present

## 2016-07-07 DIAGNOSIS — N3945 Continuous leakage: Secondary | ICD-10-CM | POA: Diagnosis not present

## 2016-07-07 DIAGNOSIS — N958 Other specified menopausal and perimenopausal disorders: Secondary | ICD-10-CM | POA: Diagnosis not present

## 2016-07-07 DIAGNOSIS — Z1231 Encounter for screening mammogram for malignant neoplasm of breast: Secondary | ICD-10-CM | POA: Diagnosis not present

## 2017-05-26 DIAGNOSIS — E7800 Pure hypercholesterolemia, unspecified: Secondary | ICD-10-CM | POA: Diagnosis not present

## 2017-05-26 DIAGNOSIS — Z1389 Encounter for screening for other disorder: Secondary | ICD-10-CM | POA: Diagnosis not present

## 2017-05-26 DIAGNOSIS — Z0000 Encounter for general adult medical examination without abnormal findings: Secondary | ICD-10-CM | POA: Diagnosis not present

## 2017-05-26 DIAGNOSIS — E663 Overweight: Secondary | ICD-10-CM | POA: Diagnosis not present

## 2017-05-26 DIAGNOSIS — I10 Essential (primary) hypertension: Secondary | ICD-10-CM | POA: Diagnosis not present

## 2017-05-26 DIAGNOSIS — G8929 Other chronic pain: Secondary | ICD-10-CM | POA: Diagnosis not present

## 2017-05-26 DIAGNOSIS — Z6835 Body mass index (BMI) 35.0-35.9, adult: Secondary | ICD-10-CM | POA: Diagnosis not present

## 2017-07-10 DIAGNOSIS — Z779 Other contact with and (suspected) exposures hazardous to health: Secondary | ICD-10-CM | POA: Diagnosis not present

## 2017-07-10 DIAGNOSIS — Z1231 Encounter for screening mammogram for malignant neoplasm of breast: Secondary | ICD-10-CM | POA: Diagnosis not present

## 2017-07-10 DIAGNOSIS — Z6834 Body mass index (BMI) 34.0-34.9, adult: Secondary | ICD-10-CM | POA: Diagnosis not present

## 2017-07-10 DIAGNOSIS — Z124 Encounter for screening for malignant neoplasm of cervix: Secondary | ICD-10-CM | POA: Diagnosis not present

## 2017-08-08 ENCOUNTER — Encounter: Payer: Self-pay | Admitting: Obstetrics and Gynecology

## 2018-06-08 DIAGNOSIS — Z1389 Encounter for screening for other disorder: Secondary | ICD-10-CM | POA: Diagnosis not present

## 2018-06-08 DIAGNOSIS — E663 Overweight: Secondary | ICD-10-CM | POA: Diagnosis not present

## 2018-06-08 DIAGNOSIS — E7800 Pure hypercholesterolemia, unspecified: Secondary | ICD-10-CM | POA: Diagnosis not present

## 2018-06-08 DIAGNOSIS — Z6835 Body mass index (BMI) 35.0-35.9, adult: Secondary | ICD-10-CM | POA: Diagnosis not present

## 2018-06-08 DIAGNOSIS — M810 Age-related osteoporosis without current pathological fracture: Secondary | ICD-10-CM | POA: Diagnosis not present

## 2018-06-08 DIAGNOSIS — Z23 Encounter for immunization: Secondary | ICD-10-CM | POA: Diagnosis not present

## 2018-06-08 DIAGNOSIS — Z0000 Encounter for general adult medical examination without abnormal findings: Secondary | ICD-10-CM | POA: Diagnosis not present

## 2018-06-08 DIAGNOSIS — G8929 Other chronic pain: Secondary | ICD-10-CM | POA: Diagnosis not present

## 2018-07-25 DIAGNOSIS — Z124 Encounter for screening for malignant neoplasm of cervix: Secondary | ICD-10-CM | POA: Diagnosis not present

## 2018-07-25 DIAGNOSIS — Z779 Other contact with and (suspected) exposures hazardous to health: Secondary | ICD-10-CM | POA: Diagnosis not present

## 2018-07-25 DIAGNOSIS — Z6835 Body mass index (BMI) 35.0-35.9, adult: Secondary | ICD-10-CM | POA: Diagnosis not present

## 2018-07-25 DIAGNOSIS — Z1231 Encounter for screening mammogram for malignant neoplasm of breast: Secondary | ICD-10-CM | POA: Diagnosis not present

## 2018-08-09 DIAGNOSIS — D128 Benign neoplasm of rectum: Secondary | ICD-10-CM | POA: Diagnosis not present

## 2018-08-09 DIAGNOSIS — K611 Rectal abscess: Secondary | ICD-10-CM | POA: Diagnosis not present

## 2018-08-28 DIAGNOSIS — K611 Rectal abscess: Secondary | ICD-10-CM | POA: Diagnosis not present

## 2018-09-13 DIAGNOSIS — K611 Rectal abscess: Secondary | ICD-10-CM | POA: Diagnosis not present

## 2018-10-11 DIAGNOSIS — Z630 Problems in relationship with spouse or partner: Secondary | ICD-10-CM | POA: Diagnosis not present

## 2018-11-06 DIAGNOSIS — K611 Rectal abscess: Secondary | ICD-10-CM | POA: Diagnosis not present

## 2019-06-21 DIAGNOSIS — M810 Age-related osteoporosis without current pathological fracture: Secondary | ICD-10-CM | POA: Diagnosis not present

## 2019-06-21 DIAGNOSIS — E663 Overweight: Secondary | ICD-10-CM | POA: Diagnosis not present

## 2019-06-21 DIAGNOSIS — G8929 Other chronic pain: Secondary | ICD-10-CM | POA: Diagnosis not present

## 2019-06-21 DIAGNOSIS — E7800 Pure hypercholesterolemia, unspecified: Secondary | ICD-10-CM | POA: Diagnosis not present

## 2019-06-21 DIAGNOSIS — Z0000 Encounter for general adult medical examination without abnormal findings: Secondary | ICD-10-CM | POA: Diagnosis not present

## 2019-06-21 DIAGNOSIS — Z1389 Encounter for screening for other disorder: Secondary | ICD-10-CM | POA: Diagnosis not present

## 2019-08-06 DIAGNOSIS — Z124 Encounter for screening for malignant neoplasm of cervix: Secondary | ICD-10-CM | POA: Diagnosis not present

## 2019-08-06 DIAGNOSIS — Z6836 Body mass index (BMI) 36.0-36.9, adult: Secondary | ICD-10-CM | POA: Diagnosis not present

## 2019-08-06 DIAGNOSIS — Z1231 Encounter for screening mammogram for malignant neoplasm of breast: Secondary | ICD-10-CM | POA: Diagnosis not present

## 2020-02-17 ENCOUNTER — Encounter (HOSPITAL_COMMUNITY): Payer: Self-pay

## 2020-02-17 ENCOUNTER — Emergency Department (HOSPITAL_COMMUNITY): Payer: Medicare Other

## 2020-02-17 ENCOUNTER — Emergency Department (HOSPITAL_COMMUNITY)
Admission: EM | Admit: 2020-02-17 | Discharge: 2020-02-18 | Disposition: A | Discharge status: Home / Self Care | Payer: Medicare Other | Attending: Emergency Medicine | Admitting: Emergency Medicine

## 2020-02-17 DIAGNOSIS — Z5321 Procedure and treatment not carried out due to patient leaving prior to being seen by health care provider: Secondary | ICD-10-CM | POA: Insufficient documentation

## 2020-02-17 DIAGNOSIS — R2242 Localized swelling, mass and lump, left lower limb: Secondary | ICD-10-CM | POA: Diagnosis not present

## 2020-02-17 DIAGNOSIS — R200 Anesthesia of skin: Secondary | ICD-10-CM | POA: Insufficient documentation

## 2020-02-17 DIAGNOSIS — M25462 Effusion, left knee: Secondary | ICD-10-CM | POA: Diagnosis not present

## 2020-02-17 NOTE — ED Notes (Signed)
Patient states she has not been seen and its been 9 hours. She said she is tired and called her daughter to come get her. States she will get her doctor to look at her results in [place].

## 2020-02-17 NOTE — ED Triage Notes (Signed)
Pt reports left knee pain and swelling for the past couple days, states the pain sometimes radiates to her pelvis and gets numbness in her foot. Pt ambulatory.

## 2020-02-19 ENCOUNTER — Other Ambulatory Visit (HOSPITAL_COMMUNITY): Payer: Self-pay | Admitting: Obstetrics and Gynecology

## 2020-02-19 ENCOUNTER — Other Ambulatory Visit: Payer: Self-pay

## 2020-02-19 ENCOUNTER — Ambulatory Visit (HOSPITAL_COMMUNITY)
Admission: RE | Admit: 2020-02-19 | Discharge: 2020-02-19 | Disposition: A | Discharge status: Home / Self Care | Payer: Medicare Other | Source: Ambulatory Visit | Attending: Obstetrics and Gynecology | Admitting: Obstetrics and Gynecology

## 2020-02-19 DIAGNOSIS — M7989 Other specified soft tissue disorders: Secondary | ICD-10-CM

## 2020-02-19 DIAGNOSIS — M79605 Pain in left leg: Secondary | ICD-10-CM | POA: Insufficient documentation

## 2020-02-19 DIAGNOSIS — R102 Pelvic and perineal pain: Secondary | ICD-10-CM | POA: Diagnosis not present

## 2020-02-19 NOTE — Progress Notes (Signed)
Left lower extremity venous duplex completed. Refer to "CV Proc" under chart review to view preliminary results.  02/19/2020 2:00 PM Kelby Aline., MHA, RVT, RDCS, RDMS

## 2020-02-21 DIAGNOSIS — M7122 Synovial cyst of popliteal space [Baker], left knee: Secondary | ICD-10-CM | POA: Diagnosis not present

## 2020-02-25 ENCOUNTER — Other Ambulatory Visit: Payer: Self-pay | Admitting: Orthopedic Surgery

## 2020-02-25 DIAGNOSIS — R102 Pelvic and perineal pain: Secondary | ICD-10-CM | POA: Diagnosis not present

## 2020-02-25 DIAGNOSIS — R52 Pain, unspecified: Secondary | ICD-10-CM

## 2020-03-11 DIAGNOSIS — S82112A Displaced fracture of left tibial spine, initial encounter for closed fracture: Secondary | ICD-10-CM | POA: Diagnosis not present

## 2020-03-11 DIAGNOSIS — S83242A Other tear of medial meniscus, current injury, left knee, initial encounter: Secondary | ICD-10-CM | POA: Diagnosis not present

## 2020-03-11 DIAGNOSIS — S83282A Other tear of lateral meniscus, current injury, left knee, initial encounter: Secondary | ICD-10-CM | POA: Diagnosis not present

## 2020-03-11 DIAGNOSIS — M65862 Other synovitis and tenosynovitis, left lower leg: Secondary | ICD-10-CM | POA: Diagnosis not present

## 2020-03-11 DIAGNOSIS — M1712 Unilateral primary osteoarthritis, left knee: Secondary | ICD-10-CM | POA: Diagnosis not present

## 2020-03-11 DIAGNOSIS — R600 Localized edema: Secondary | ICD-10-CM | POA: Diagnosis not present

## 2020-03-11 DIAGNOSIS — M25462 Effusion, left knee: Secondary | ICD-10-CM | POA: Diagnosis not present

## 2020-03-11 DIAGNOSIS — M7122 Synovial cyst of popliteal space [Baker], left knee: Secondary | ICD-10-CM | POA: Diagnosis not present

## 2020-03-11 DIAGNOSIS — M948X6 Other specified disorders of cartilage, lower leg: Secondary | ICD-10-CM | POA: Diagnosis not present

## 2020-03-28 ENCOUNTER — Other Ambulatory Visit: Payer: Medicare Other

## 2020-04-16 DIAGNOSIS — Z9889 Other specified postprocedural states: Secondary | ICD-10-CM | POA: Diagnosis not present

## 2020-04-16 DIAGNOSIS — M25462 Effusion, left knee: Secondary | ICD-10-CM | POA: Diagnosis not present

## 2020-04-16 DIAGNOSIS — Z7409 Other reduced mobility: Secondary | ICD-10-CM | POA: Diagnosis not present

## 2020-04-16 DIAGNOSIS — M25662 Stiffness of left knee, not elsewhere classified: Secondary | ICD-10-CM | POA: Diagnosis not present

## 2020-04-16 DIAGNOSIS — M25562 Pain in left knee: Secondary | ICD-10-CM | POA: Diagnosis not present

## 2020-04-23 DIAGNOSIS — M25662 Stiffness of left knee, not elsewhere classified: Secondary | ICD-10-CM | POA: Diagnosis not present

## 2020-04-23 DIAGNOSIS — Z9889 Other specified postprocedural states: Secondary | ICD-10-CM | POA: Diagnosis not present

## 2020-04-23 DIAGNOSIS — Z7409 Other reduced mobility: Secondary | ICD-10-CM | POA: Diagnosis not present

## 2020-04-23 DIAGNOSIS — M25562 Pain in left knee: Secondary | ICD-10-CM | POA: Diagnosis not present

## 2020-04-23 DIAGNOSIS — M25462 Effusion, left knee: Secondary | ICD-10-CM | POA: Diagnosis not present

## 2020-04-29 DIAGNOSIS — Z9889 Other specified postprocedural states: Secondary | ICD-10-CM | POA: Diagnosis not present

## 2020-04-29 DIAGNOSIS — M25562 Pain in left knee: Secondary | ICD-10-CM | POA: Diagnosis not present

## 2020-04-29 DIAGNOSIS — M25462 Effusion, left knee: Secondary | ICD-10-CM | POA: Diagnosis not present

## 2020-04-29 DIAGNOSIS — Z7409 Other reduced mobility: Secondary | ICD-10-CM | POA: Diagnosis not present

## 2020-04-29 DIAGNOSIS — M25662 Stiffness of left knee, not elsewhere classified: Secondary | ICD-10-CM | POA: Diagnosis not present

## 2020-05-08 DIAGNOSIS — Z9889 Other specified postprocedural states: Secondary | ICD-10-CM | POA: Diagnosis not present

## 2020-05-08 DIAGNOSIS — M25562 Pain in left knee: Secondary | ICD-10-CM | POA: Diagnosis not present

## 2020-05-08 DIAGNOSIS — M25662 Stiffness of left knee, not elsewhere classified: Secondary | ICD-10-CM | POA: Diagnosis not present

## 2020-05-08 DIAGNOSIS — M25462 Effusion, left knee: Secondary | ICD-10-CM | POA: Diagnosis not present

## 2020-05-08 DIAGNOSIS — Z7409 Other reduced mobility: Secondary | ICD-10-CM | POA: Diagnosis not present

## 2020-06-24 DIAGNOSIS — E7800 Pure hypercholesterolemia, unspecified: Secondary | ICD-10-CM | POA: Diagnosis not present

## 2020-06-24 DIAGNOSIS — M810 Age-related osteoporosis without current pathological fracture: Secondary | ICD-10-CM | POA: Diagnosis not present

## 2020-06-24 DIAGNOSIS — Z0000 Encounter for general adult medical examination without abnormal findings: Secondary | ICD-10-CM | POA: Diagnosis not present

## 2020-06-24 DIAGNOSIS — G8929 Other chronic pain: Secondary | ICD-10-CM | POA: Diagnosis not present

## 2020-06-24 DIAGNOSIS — R030 Elevated blood-pressure reading, without diagnosis of hypertension: Secondary | ICD-10-CM | POA: Diagnosis not present

## 2020-07-09 DIAGNOSIS — I10 Essential (primary) hypertension: Secondary | ICD-10-CM | POA: Diagnosis not present

## 2020-07-13 DIAGNOSIS — H2512 Age-related nuclear cataract, left eye: Secondary | ICD-10-CM | POA: Diagnosis not present

## 2020-07-13 DIAGNOSIS — H2513 Age-related nuclear cataract, bilateral: Secondary | ICD-10-CM | POA: Diagnosis not present

## 2020-08-05 DIAGNOSIS — H2512 Age-related nuclear cataract, left eye: Secondary | ICD-10-CM | POA: Diagnosis not present

## 2020-08-12 DIAGNOSIS — H2512 Age-related nuclear cataract, left eye: Secondary | ICD-10-CM | POA: Diagnosis not present

## 2020-08-13 DIAGNOSIS — H25011 Cortical age-related cataract, right eye: Secondary | ICD-10-CM | POA: Diagnosis not present

## 2020-08-13 DIAGNOSIS — H2512 Age-related nuclear cataract, left eye: Secondary | ICD-10-CM | POA: Diagnosis not present

## 2020-08-13 DIAGNOSIS — H2511 Age-related nuclear cataract, right eye: Secondary | ICD-10-CM | POA: Diagnosis not present

## 2020-08-13 DIAGNOSIS — H25041 Posterior subcapsular polar age-related cataract, right eye: Secondary | ICD-10-CM | POA: Diagnosis not present

## 2020-08-17 DIAGNOSIS — I10 Essential (primary) hypertension: Secondary | ICD-10-CM | POA: Diagnosis not present

## 2020-08-17 DIAGNOSIS — E7800 Pure hypercholesterolemia, unspecified: Secondary | ICD-10-CM | POA: Diagnosis not present

## 2020-09-03 DIAGNOSIS — H2511 Age-related nuclear cataract, right eye: Secondary | ICD-10-CM | POA: Diagnosis not present

## 2020-09-09 DIAGNOSIS — Z1231 Encounter for screening mammogram for malignant neoplasm of breast: Secondary | ICD-10-CM | POA: Diagnosis not present

## 2020-09-09 DIAGNOSIS — Z124 Encounter for screening for malignant neoplasm of cervix: Secondary | ICD-10-CM | POA: Diagnosis not present

## 2020-09-09 DIAGNOSIS — Z6836 Body mass index (BMI) 36.0-36.9, adult: Secondary | ICD-10-CM | POA: Diagnosis not present

## 2020-09-09 DIAGNOSIS — M8588 Other specified disorders of bone density and structure, other site: Secondary | ICD-10-CM | POA: Diagnosis not present

## 2020-09-09 DIAGNOSIS — N958 Other specified menopausal and perimenopausal disorders: Secondary | ICD-10-CM | POA: Diagnosis not present

## 2020-09-23 DIAGNOSIS — I10 Essential (primary) hypertension: Secondary | ICD-10-CM | POA: Diagnosis not present

## 2020-09-23 DIAGNOSIS — E7800 Pure hypercholesterolemia, unspecified: Secondary | ICD-10-CM | POA: Diagnosis not present

## 2020-10-10 DIAGNOSIS — U071 COVID-19: Secondary | ICD-10-CM | POA: Diagnosis not present

## 2020-10-21 DIAGNOSIS — Z800 Family history of malignant neoplasm of digestive organs: Secondary | ICD-10-CM | POA: Diagnosis not present

## 2020-11-20 ENCOUNTER — Other Ambulatory Visit: Payer: Self-pay | Admitting: Physician Assistant

## 2020-11-20 DIAGNOSIS — Z800 Family history of malignant neoplasm of digestive organs: Secondary | ICD-10-CM

## 2020-12-13 ENCOUNTER — Ambulatory Visit
Admission: RE | Admit: 2020-12-13 | Discharge: 2020-12-13 | Disposition: A | Discharge status: Home / Self Care | Payer: Medicare Other | Source: Ambulatory Visit | Attending: Physician Assistant | Admitting: Physician Assistant

## 2020-12-13 ENCOUNTER — Other Ambulatory Visit: Payer: Self-pay

## 2020-12-13 DIAGNOSIS — Z800 Family history of malignant neoplasm of digestive organs: Secondary | ICD-10-CM

## 2021-01-01 DIAGNOSIS — H20012 Primary iridocyclitis, left eye: Secondary | ICD-10-CM | POA: Diagnosis not present

## 2021-01-08 DIAGNOSIS — H20012 Primary iridocyclitis, left eye: Secondary | ICD-10-CM | POA: Diagnosis not present

## 2021-01-14 DIAGNOSIS — M1712 Unilateral primary osteoarthritis, left knee: Secondary | ICD-10-CM | POA: Diagnosis not present

## 2021-01-14 DIAGNOSIS — S83422A Sprain of lateral collateral ligament of left knee, initial encounter: Secondary | ICD-10-CM | POA: Diagnosis not present

## 2021-01-16 ENCOUNTER — Other Ambulatory Visit: Payer: Self-pay

## 2021-01-16 ENCOUNTER — Ambulatory Visit
Admission: RE | Admit: 2021-01-16 | Discharge: 2021-01-16 | Disposition: A | Discharge status: Home / Self Care | Payer: Medicare Other | Source: Ambulatory Visit | Attending: Physician Assistant | Admitting: Physician Assistant

## 2021-01-16 DIAGNOSIS — R935 Abnormal findings on diagnostic imaging of other abdominal regions, including retroperitoneum: Secondary | ICD-10-CM | POA: Diagnosis not present

## 2021-01-16 DIAGNOSIS — K760 Fatty (change of) liver, not elsewhere classified: Secondary | ICD-10-CM | POA: Diagnosis not present

## 2021-01-16 MED ORDER — GADOBENATE DIMEGLUMINE 529 MG/ML IV SOLN
18.0000 mL | Freq: Once | INTRAVENOUS | Status: AC | PRN
  Administered 2021-01-16: 18 mL via INTRAVENOUS

## 2021-02-05 DIAGNOSIS — H20013 Primary iridocyclitis, bilateral: Secondary | ICD-10-CM | POA: Diagnosis not present

## 2021-06-25 IMAGING — CR DG KNEE COMPLETE 4+V*L*
4 series · 4 of 4 positions shown · non-contrast
Comparison: None.

CLINICAL DATA: Left knee pain and swelling for 1 day.  No injury.

EXAM:
LEFT KNEE - COMPLETE 4+ VIEW

[knee ap]
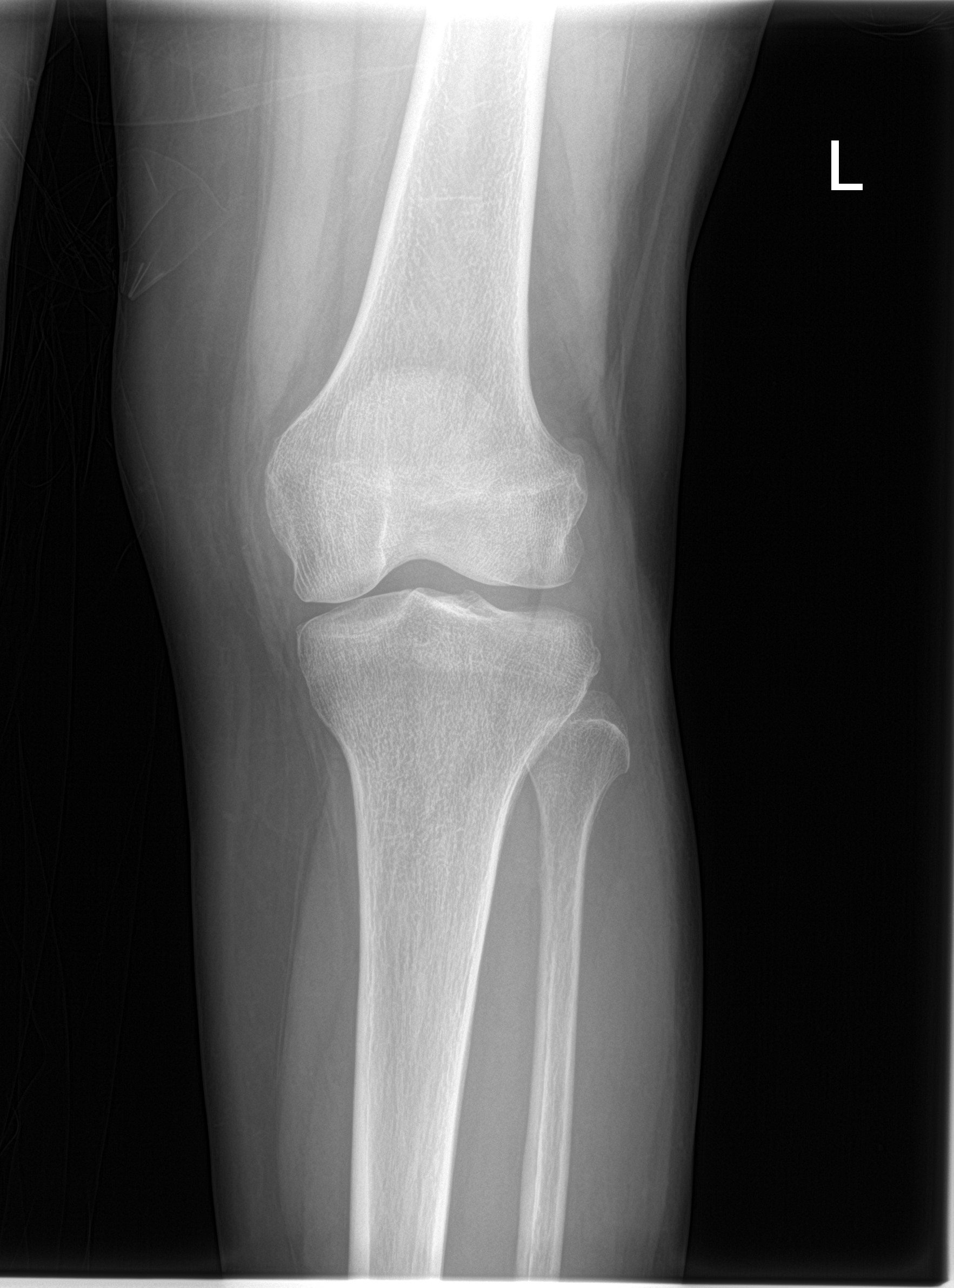

[knee lat]
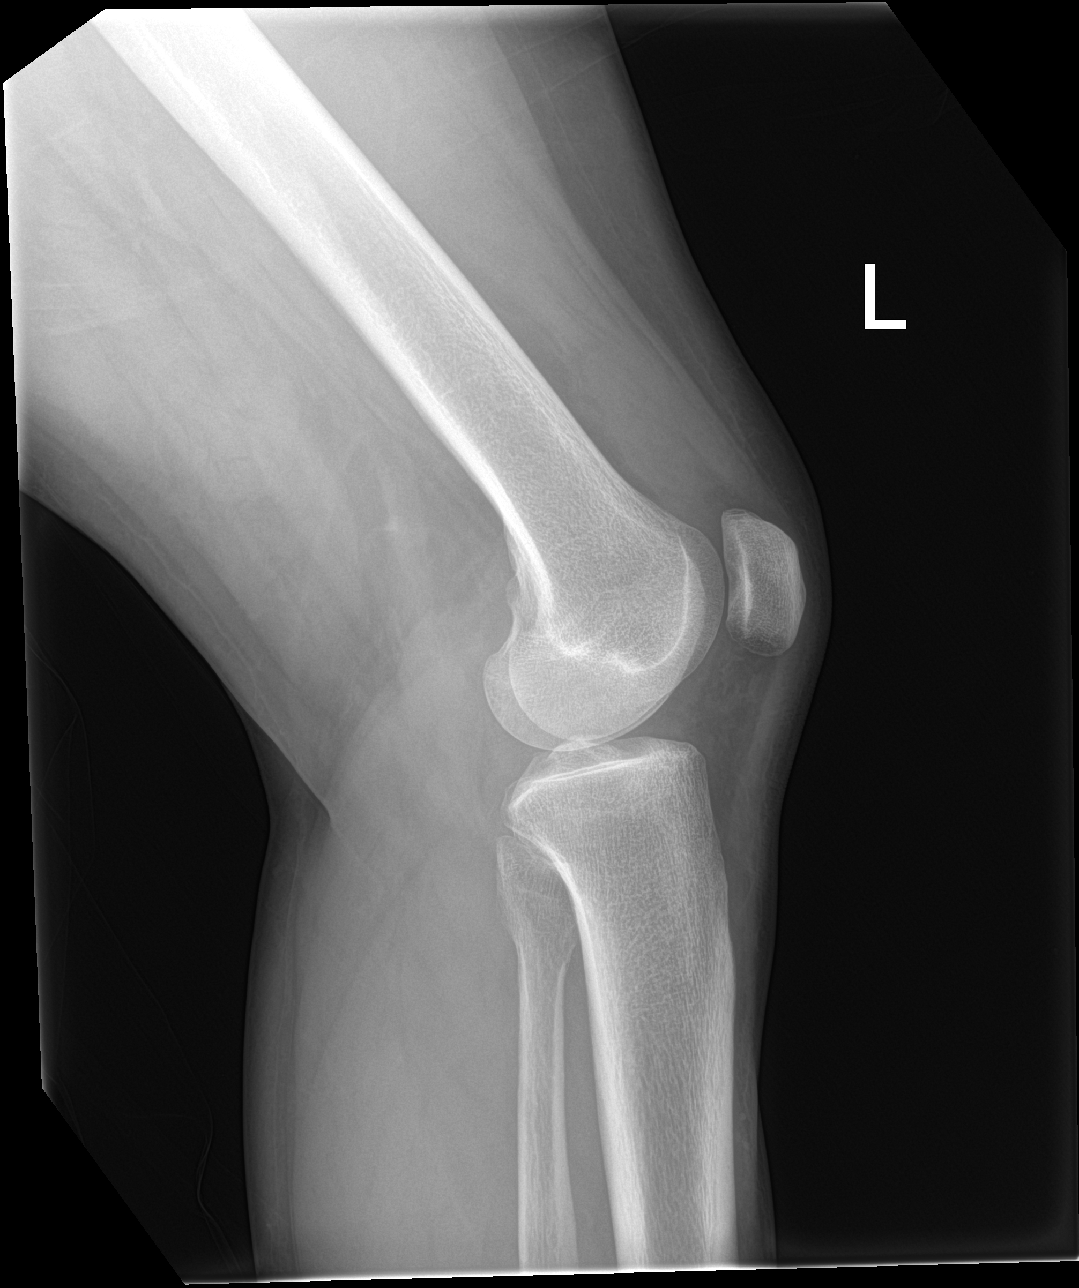

[knee obl (1 of 2)]
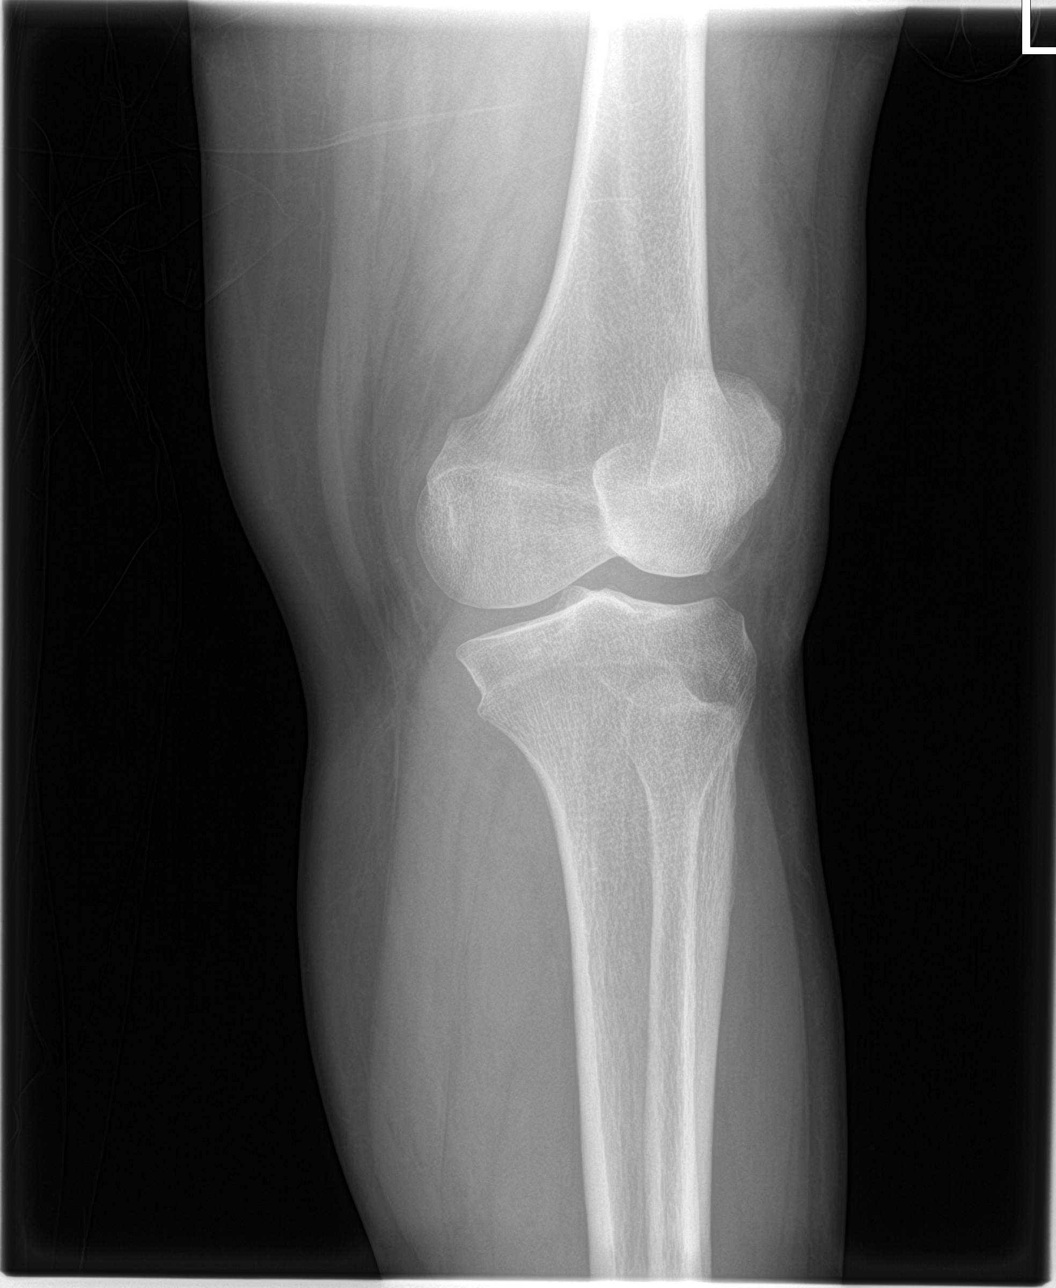

[knee obl (2 of 2)]
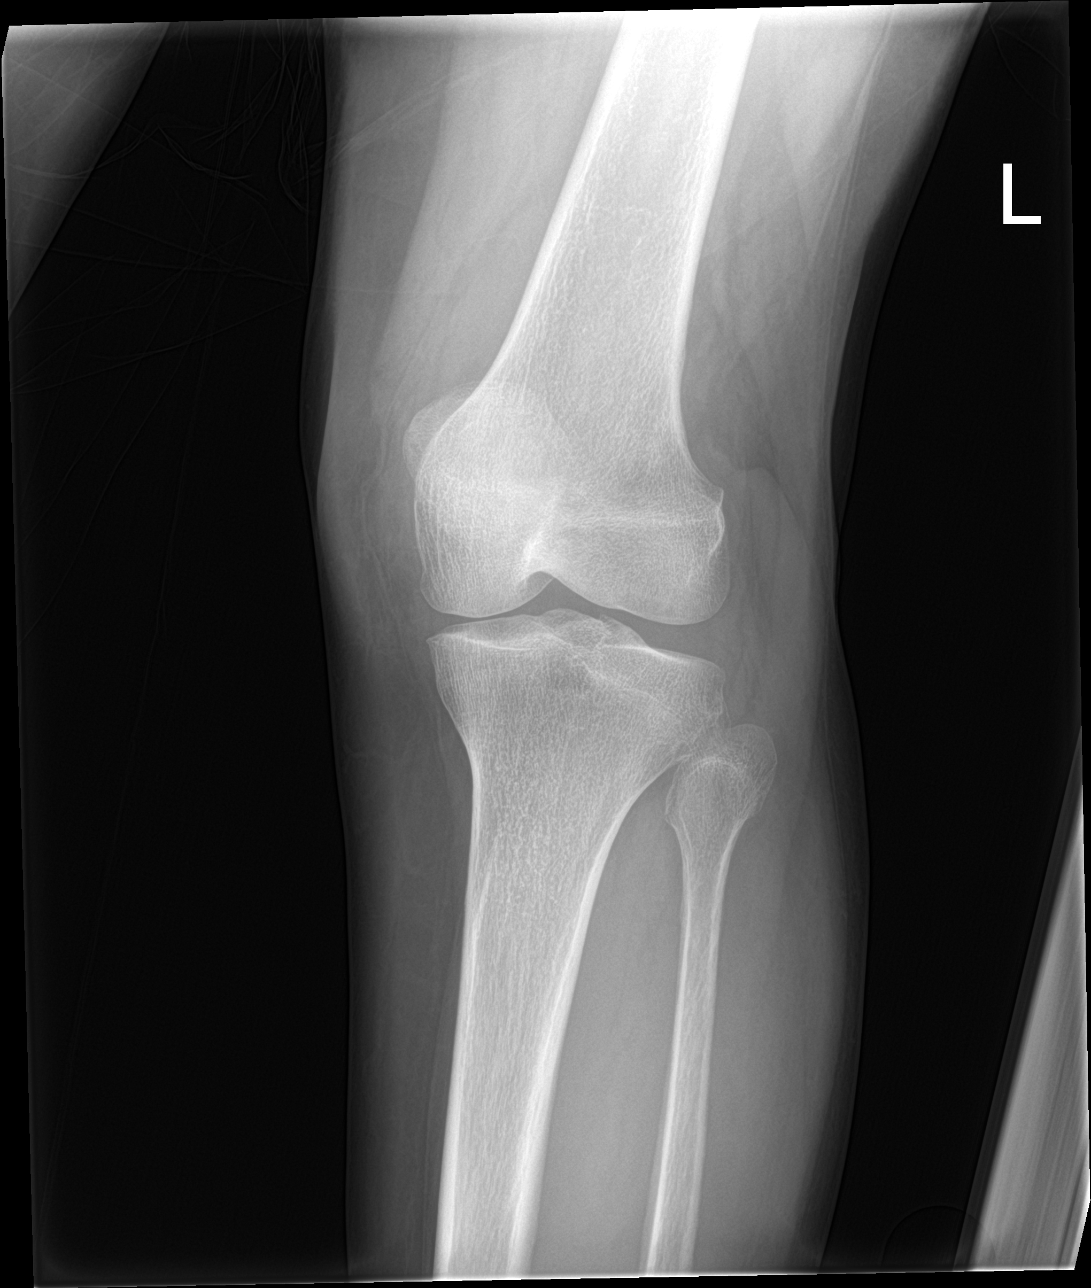

[4 of 4 positions shown; findings below may reference images not displayed]

FINDINGS: No fracture or bone lesion.

Knee joint normally spaced and aligned.  No arthropathic changes.

Small joint effusion.

Surrounding soft tissues are unremarkable.
IMPRESSION: 1. Small joint effusion.  No other abnormality.

## 2021-06-29 ENCOUNTER — Other Ambulatory Visit: Payer: Self-pay | Admitting: Internal Medicine

## 2021-06-29 ENCOUNTER — Ambulatory Visit
Admission: RE | Admit: 2021-06-29 | Discharge: 2021-06-29 | Disposition: A | Discharge status: Home / Self Care | Payer: Medicare Other | Source: Ambulatory Visit | Attending: Internal Medicine | Admitting: Internal Medicine

## 2021-06-29 DIAGNOSIS — Z0000 Encounter for general adult medical examination without abnormal findings: Secondary | ICD-10-CM | POA: Diagnosis not present

## 2021-06-29 DIAGNOSIS — M1611 Unilateral primary osteoarthritis, right hip: Secondary | ICD-10-CM | POA: Diagnosis not present

## 2021-06-29 DIAGNOSIS — M25559 Pain in unspecified hip: Secondary | ICD-10-CM | POA: Diagnosis not present

## 2021-06-29 DIAGNOSIS — E7800 Pure hypercholesterolemia, unspecified: Secondary | ICD-10-CM | POA: Diagnosis not present

## 2021-06-29 DIAGNOSIS — I878 Other specified disorders of veins: Secondary | ICD-10-CM | POA: Diagnosis not present

## 2021-06-29 DIAGNOSIS — Z5181 Encounter for therapeutic drug level monitoring: Secondary | ICD-10-CM | POA: Diagnosis not present

## 2021-06-29 DIAGNOSIS — M47816 Spondylosis without myelopathy or radiculopathy, lumbar region: Secondary | ICD-10-CM | POA: Diagnosis not present

## 2021-06-29 DIAGNOSIS — I10 Essential (primary) hypertension: Secondary | ICD-10-CM | POA: Diagnosis not present

## 2021-06-29 DIAGNOSIS — G8929 Other chronic pain: Secondary | ICD-10-CM | POA: Diagnosis not present

## 2021-06-29 DIAGNOSIS — M810 Age-related osteoporosis without current pathological fracture: Secondary | ICD-10-CM | POA: Diagnosis not present

## 2021-09-13 DIAGNOSIS — Z1231 Encounter for screening mammogram for malignant neoplasm of breast: Secondary | ICD-10-CM | POA: Diagnosis not present

## 2021-09-13 DIAGNOSIS — Z6835 Body mass index (BMI) 35.0-35.9, adult: Secondary | ICD-10-CM | POA: Diagnosis not present

## 2021-09-13 DIAGNOSIS — Z124 Encounter for screening for malignant neoplasm of cervix: Secondary | ICD-10-CM | POA: Diagnosis not present

## 2021-09-15 DIAGNOSIS — Z124 Encounter for screening for malignant neoplasm of cervix: Secondary | ICD-10-CM | POA: Diagnosis not present

## 2022-02-03 DIAGNOSIS — K760 Fatty (change of) liver, not elsewhere classified: Secondary | ICD-10-CM | POA: Diagnosis not present

## 2022-02-03 DIAGNOSIS — Z1211 Encounter for screening for malignant neoplasm of colon: Secondary | ICD-10-CM | POA: Diagnosis not present

## 2022-02-03 DIAGNOSIS — Z800 Family history of malignant neoplasm of digestive organs: Secondary | ICD-10-CM | POA: Diagnosis not present

## 2022-02-03 DIAGNOSIS — E7800 Pure hypercholesterolemia, unspecified: Secondary | ICD-10-CM | POA: Diagnosis not present

## 2022-02-09 DIAGNOSIS — Z20822 Contact with and (suspected) exposure to covid-19: Secondary | ICD-10-CM | POA: Diagnosis not present

## 2022-02-22 DIAGNOSIS — E7800 Pure hypercholesterolemia, unspecified: Secondary | ICD-10-CM | POA: Diagnosis not present

## 2022-02-22 DIAGNOSIS — I10 Essential (primary) hypertension: Secondary | ICD-10-CM | POA: Diagnosis not present

## 2022-05-25 IMAGING — MR MR ABDOMEN WO/W CM MRCP
13 of 18 series · 33 of 48 positions shown · IV contrast (18 ml multihance)
Comparison: None.

CLINICAL DATA: Positive family history of pancreatic carcinoma.

EXAM:
MRI ABDOMEN WITHOUT AND WITH CONTRAST (INCLUDING MRCP)
TECHNIQUE: Multiplanar multisequence MR imaging of the abdomen was performed
both before and after the administration of intravenous contrast.
Heavily T2-weighted images of the biliary and pancreatic ducts were
obtained, and three-dimensional MRCP images were rendered by post
processing.
CONTRAST:  18mL MULTIHANCE GADOBENATE DIMEGLUMINE 529 MG/ML IV SOLN

[Series 3: T2 · coronal · 5.0mm · 1.56mm/px · 2 of 35 slices shown (1 of 3)]
[im 1/35]
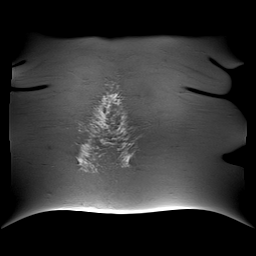
[im 35/35]
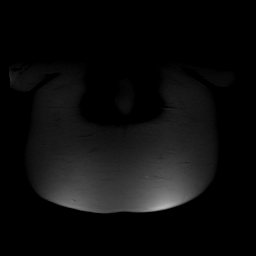

[Series 4: T2 · axial · 6.0mm · 1.56mm/px · z∈[-96,+146]mm · 2 of 36 slices shown (2 of 3)]
[im 1/36]
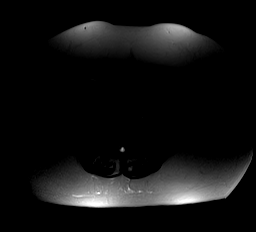
[im 36/36]
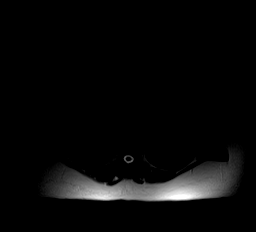

[Series 7: T2 · axial · 6.0mm · 0.78mm/px · 1 of 36 slices shown (3 of 3)]
[im 1/36]
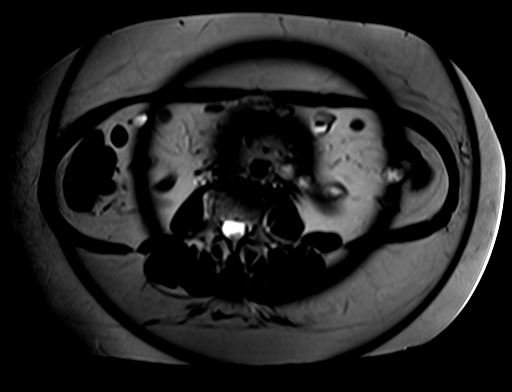

[Series 10: [id number] · axial · 6.0mm · 2.08mm/px · z∈[-53,+216]mm · 5 of 120 slices shown]
[im 1/120]
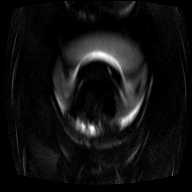
[im 30/120]
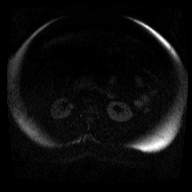
[im 60/120]
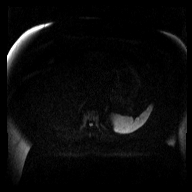
[im 90/120]
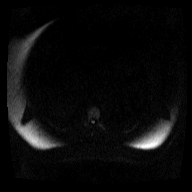
[im 120/120]
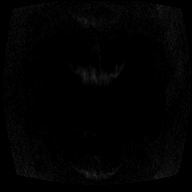

[Series 11: [id number]_adc · axial · 6.0mm · 2.08mm/px · z∈[-53,+216]mm · 2 of 40 slices shown]
[im 1/40]
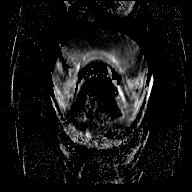
[im 40/40]
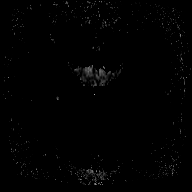

[Series 12: axial tru fisp · axial · 5.0mm · 1.56mm/px · z∈[-104,+148]mm · 2 of 43 slices shown]
[im 1/43]
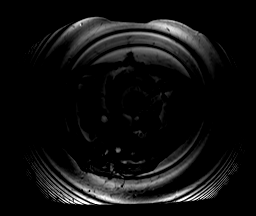
[im 43/43]
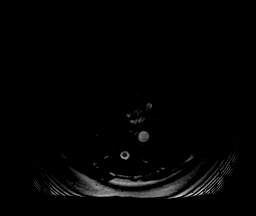

[Series 13: axial in out · axial · 6.0mm · 0.78mm/px · z∈[-95,+139]mm · 3 of 70 slices shown]
[im 1/70]
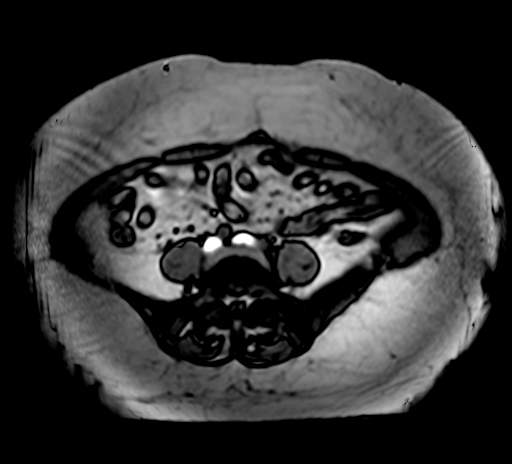
[im 35/70]
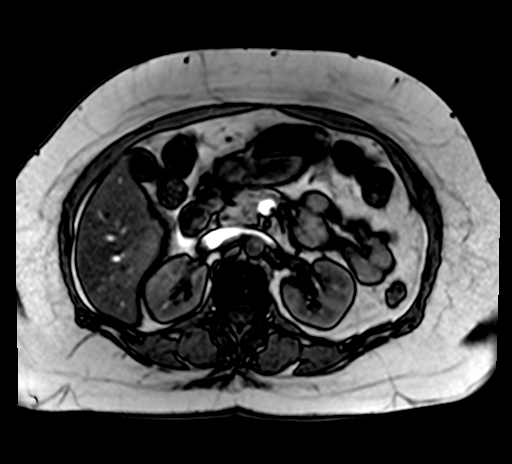
[im 70/70]
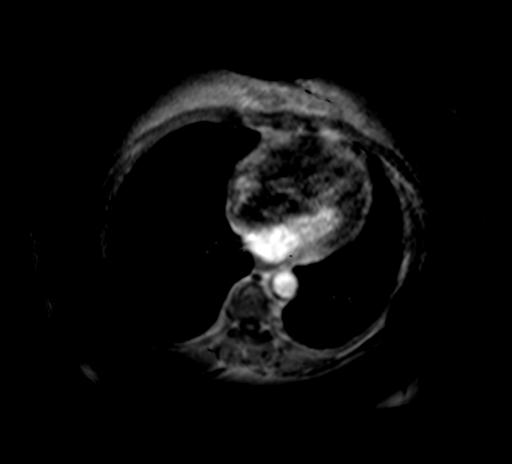

[Series 14: T1 dynamic · axial · non-contrast · 2.5mm · 0.74mm/px · z∈[-66,+131]mm · 3 of 80 slices shown (1 of 2)]
[im 1/80]
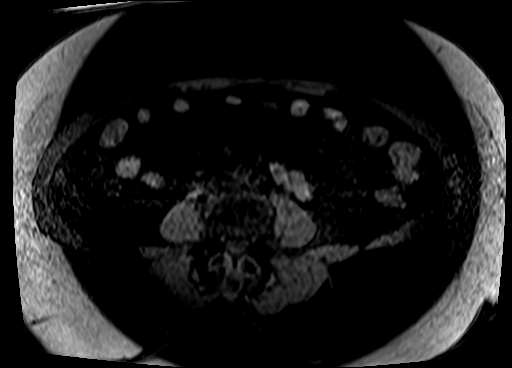
[im 40/80]
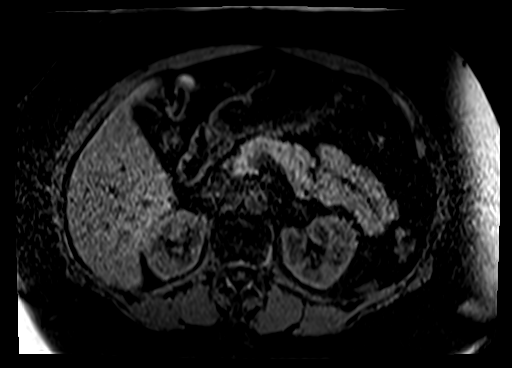
[im 80/80]
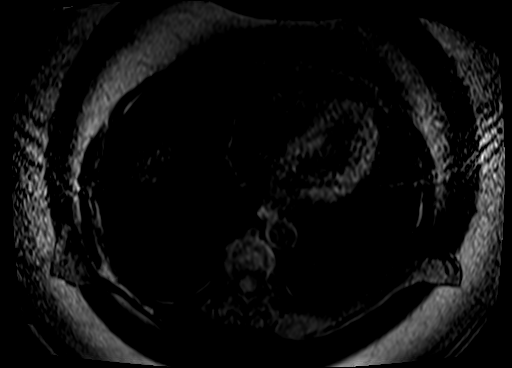

[Series 15: post 25 sec · axial · 2.5mm · 0.74mm/px · z∈[-66,+131]mm · 3 of 80 slices shown]
[im 1/80]
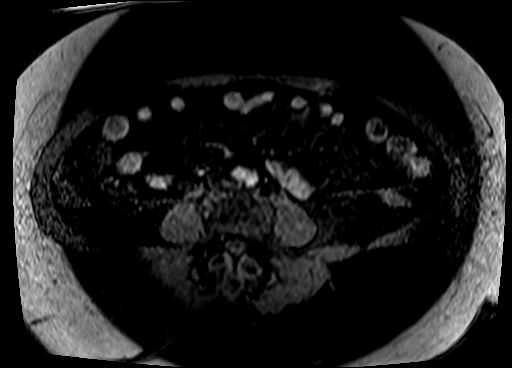
[im 40/80]
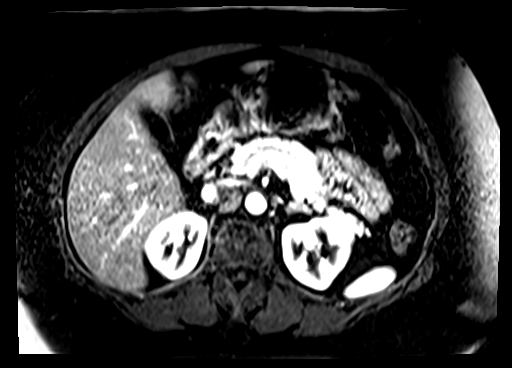
[im 80/80]
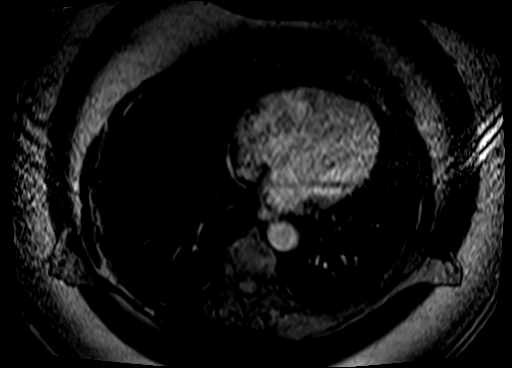

[Series 16: post 25 sec_sub · axial · 2.5mm · 0.74mm/px · z∈[-66,+131]mm · 3 of 80 slices shown]
[im 1/80]
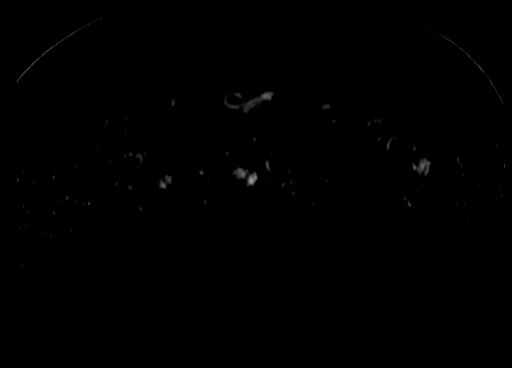
[im 40/80]
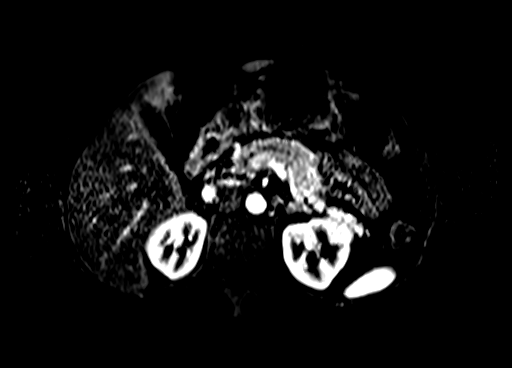
[im 80/80]
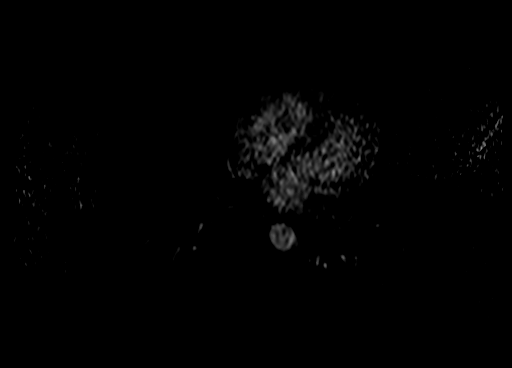

[Series 17: post 90 sec · axial · 2.5mm · 0.74mm/px · z∈[-66,+131]mm · 3 of 80 slices shown]
[im 1/80]
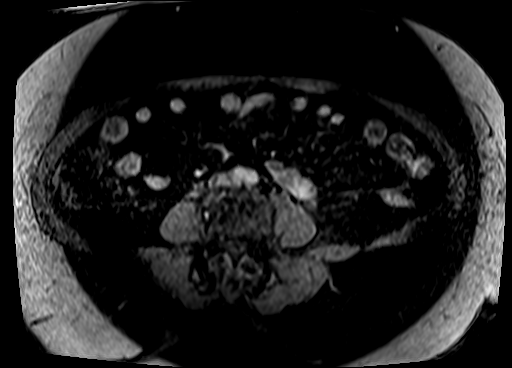
[im 40/80]
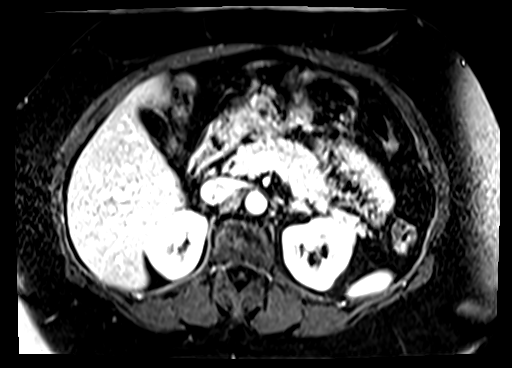
[im 80/80]
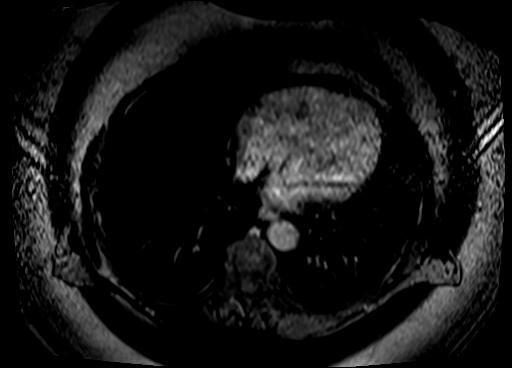

[Series 18: post 90 sec_sub · axial · 2.5mm · 0.74mm/px · z∈[-66,+131]mm · 3 of 80 slices shown]
[im 1/80]
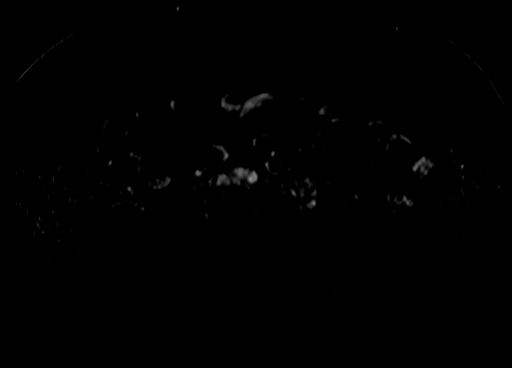
[im 40/80]
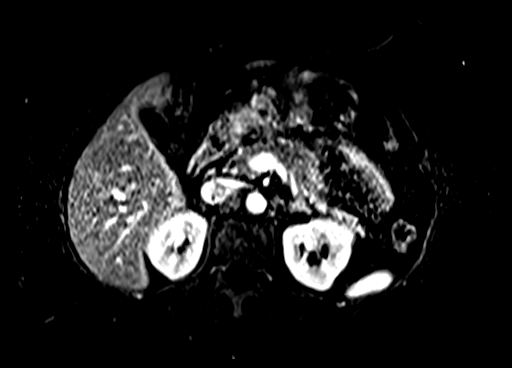
[im 80/80]
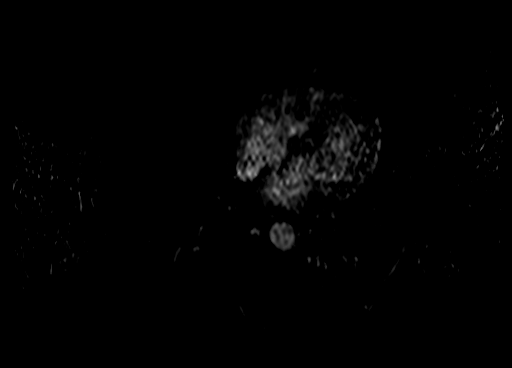

[Series 19: T1 dynamic · coronal · 3.0mm · 0.70mm/px · 1 of 52 slices shown (2 of 2)]
[im 1/52]
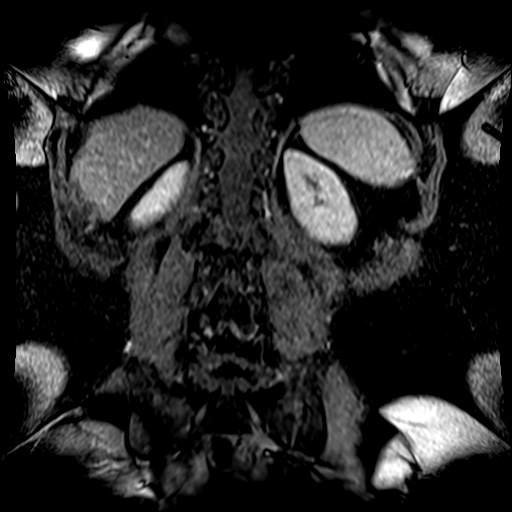

[33 of 48 positions shown; findings below may reference images not displayed]

FINDINGS: Lower chest: No acute findings.

Hepatobiliary: No hepatic masses identified. Moderate diffuse
hepatic steatosis is seen on chemical shift imaging. Gallbladder is
unremarkable. No evidence of biliary ductal dilatation.

Pancreas: Normal appearance of pancreas. No evidence of pancreatic
mass or ductal dilatation. No findings of acute pancreatitis. No
evidence of pancreas divisum.

Spleen:  Within normal limits in size and appearance.

Adrenals/Urinary Tract: No masses identified. No evidence of
hydronephrosis.

Stomach/Bowel: Visualized portion unremarkable.

Vascular/Lymphatic: No pathologically enlarged lymph nodes
identified. No abdominal aortic aneurysm.

Other:  None.

Musculoskeletal:  No suspicious bone lesions identified.
IMPRESSION: No evidence of pancreatic mass or other abdominal neoplasm.

Moderate diffuse hepatic steatosis.

## 2022-09-19 DIAGNOSIS — Z1231 Encounter for screening mammogram for malignant neoplasm of breast: Secondary | ICD-10-CM | POA: Diagnosis not present

## 2022-10-06 DIAGNOSIS — Z6834 Body mass index (BMI) 34.0-34.9, adult: Secondary | ICD-10-CM | POA: Diagnosis not present

## 2022-10-06 DIAGNOSIS — Z5181 Encounter for therapeutic drug level monitoring: Secondary | ICD-10-CM | POA: Diagnosis not present

## 2022-10-06 DIAGNOSIS — Z0000 Encounter for general adult medical examination without abnormal findings: Secondary | ICD-10-CM | POA: Diagnosis not present

## 2022-10-06 DIAGNOSIS — K760 Fatty (change of) liver, not elsewhere classified: Secondary | ICD-10-CM | POA: Diagnosis not present

## 2022-10-06 DIAGNOSIS — Z1272 Encounter for screening for malignant neoplasm of vagina: Secondary | ICD-10-CM | POA: Diagnosis not present

## 2022-10-06 DIAGNOSIS — G8929 Other chronic pain: Secondary | ICD-10-CM | POA: Diagnosis not present

## 2022-10-06 DIAGNOSIS — E7800 Pure hypercholesterolemia, unspecified: Secondary | ICD-10-CM | POA: Diagnosis not present

## 2022-10-06 DIAGNOSIS — Z79899 Other long term (current) drug therapy: Secondary | ICD-10-CM | POA: Diagnosis not present

## 2022-10-06 DIAGNOSIS — Z124 Encounter for screening for malignant neoplasm of cervix: Secondary | ICD-10-CM | POA: Diagnosis not present

## 2022-10-06 DIAGNOSIS — I10 Essential (primary) hypertension: Secondary | ICD-10-CM | POA: Diagnosis not present

## 2022-10-07 ENCOUNTER — Other Ambulatory Visit: Payer: Self-pay | Admitting: Obstetrics and Gynecology

## 2022-10-07 DIAGNOSIS — Z800 Family history of malignant neoplasm of digestive organs: Secondary | ICD-10-CM

## 2022-11-10 ENCOUNTER — Other Ambulatory Visit: Payer: Medicare Other

## 2022-11-27 ENCOUNTER — Ambulatory Visit
Admission: RE | Admit: 2022-11-27 | Discharge: 2022-11-27 | Disposition: A | Discharge status: Home / Self Care | Payer: Medicare Other | Source: Ambulatory Visit | Attending: Obstetrics and Gynecology | Admitting: Obstetrics and Gynecology

## 2022-11-27 DIAGNOSIS — Z800 Family history of malignant neoplasm of digestive organs: Secondary | ICD-10-CM

## 2022-11-27 DIAGNOSIS — K760 Fatty (change of) liver, not elsewhere classified: Secondary | ICD-10-CM | POA: Diagnosis not present

## 2022-11-27 MED ORDER — GADOPICLENOL 0.5 MMOL/ML IV SOLN
9.0000 mL | Freq: Once | INTRAVENOUS | Status: AC | PRN
  Administered 2022-11-27: 9 mL via INTRAVENOUS

## 2023-04-12 DIAGNOSIS — E7800 Pure hypercholesterolemia, unspecified: Secondary | ICD-10-CM | POA: Diagnosis not present

## 2023-04-12 DIAGNOSIS — G8929 Other chronic pain: Secondary | ICD-10-CM | POA: Diagnosis not present

## 2023-04-12 DIAGNOSIS — I10 Essential (primary) hypertension: Secondary | ICD-10-CM | POA: Diagnosis not present

## 2023-09-21 DIAGNOSIS — N958 Other specified menopausal and perimenopausal disorders: Secondary | ICD-10-CM | POA: Diagnosis not present

## 2023-09-21 DIAGNOSIS — M8588 Other specified disorders of bone density and structure, other site: Secondary | ICD-10-CM | POA: Diagnosis not present

## 2023-09-21 DIAGNOSIS — Z1231 Encounter for screening mammogram for malignant neoplasm of breast: Secondary | ICD-10-CM | POA: Diagnosis not present

## 2023-10-13 ENCOUNTER — Other Ambulatory Visit: Payer: Self-pay | Admitting: Obstetrics and Gynecology

## 2023-10-13 DIAGNOSIS — Z800 Family history of malignant neoplasm of digestive organs: Secondary | ICD-10-CM

## 2023-11-08 ENCOUNTER — Other Ambulatory Visit: Payer: Self-pay | Admitting: Orthopaedic Surgery

## 2023-11-08 DIAGNOSIS — G8929 Other chronic pain: Secondary | ICD-10-CM

## 2023-11-09 ENCOUNTER — Ambulatory Visit
Admission: RE | Admit: 2023-11-09 | Discharge: 2023-11-09 | Disposition: A | Discharge status: Home / Self Care | Payer: Medicare Other | Source: Ambulatory Visit | Attending: Obstetrics and Gynecology | Admitting: Obstetrics and Gynecology

## 2023-11-09 DIAGNOSIS — Z800 Family history of malignant neoplasm of digestive organs: Secondary | ICD-10-CM

## 2023-11-09 MED ORDER — GADOPICLENOL 0.5 MMOL/ML IV SOLN
9.0000 mL | Freq: Once | INTRAVENOUS | Status: AC | PRN
  Administered 2023-11-09: 9 mL via INTRAVENOUS

## 2023-12-02 ENCOUNTER — Ambulatory Visit
Admission: RE | Admit: 2023-12-02 | Discharge: 2023-12-02 | Disposition: A | Discharge status: Home / Self Care | Payer: Medicare Other | Source: Ambulatory Visit | Attending: Orthopaedic Surgery | Admitting: Orthopaedic Surgery

## 2023-12-02 DIAGNOSIS — G8929 Other chronic pain: Secondary | ICD-10-CM

## 2024-10-22 ENCOUNTER — Other Ambulatory Visit: Payer: Self-pay | Admitting: Obstetrics and Gynecology

## 2024-10-22 DIAGNOSIS — Z800 Family history of malignant neoplasm of digestive organs: Secondary | ICD-10-CM

## 2024-10-24 ENCOUNTER — Encounter: Payer: Self-pay | Admitting: Obstetrics and Gynecology

## 2024-11-17 ENCOUNTER — Other Ambulatory Visit
